# Patient Record
Sex: Male | Born: 1970
Health system: Southern US, Community
[De-identification: ages and names within clinical notes are randomized; demographics above are authoritative.]

## PROBLEM LIST (undated history)

## (undated) DIAGNOSIS — N289 Disorder of kidney and ureter, unspecified: Secondary | ICD-10-CM

## (undated) DIAGNOSIS — E785 Hyperlipidemia, unspecified: Secondary | ICD-10-CM

## (undated) DIAGNOSIS — I10 Essential (primary) hypertension: Secondary | ICD-10-CM

## (undated) DIAGNOSIS — K589 Irritable bowel syndrome without diarrhea: Secondary | ICD-10-CM

## (undated) HISTORY — DX: Hyperlipidemia, unspecified: E78.5

## (undated) HISTORY — PX: LASIK: SHX215

## (undated) HISTORY — DX: Essential (primary) hypertension: I10

---

## 2015-08-16 ENCOUNTER — Emergency Department (HOSPITAL_COMMUNITY): Payer: BLUE CROSS/BLUE SHIELD

## 2015-08-16 ENCOUNTER — Encounter (HOSPITAL_COMMUNITY): Payer: Self-pay

## 2015-08-16 ENCOUNTER — Emergency Department (HOSPITAL_COMMUNITY)
Admission: EM | Admit: 2015-08-16 | Discharge: 2015-08-16 | Disposition: A | Discharge status: Home / Self Care | Payer: BLUE CROSS/BLUE SHIELD | Attending: Emergency Medicine | Admitting: Emergency Medicine

## 2015-08-16 DIAGNOSIS — N201 Calculus of ureter: Secondary | ICD-10-CM | POA: Insufficient documentation

## 2015-08-16 DIAGNOSIS — Z79899 Other long term (current) drug therapy: Secondary | ICD-10-CM | POA: Diagnosis not present

## 2015-08-16 DIAGNOSIS — R1032 Left lower quadrant pain: Secondary | ICD-10-CM | POA: Diagnosis present

## 2015-08-16 DIAGNOSIS — R109 Unspecified abdominal pain: Secondary | ICD-10-CM

## 2015-08-16 DIAGNOSIS — N132 Hydronephrosis with renal and ureteral calculous obstruction: Secondary | ICD-10-CM | POA: Diagnosis not present

## 2015-08-16 DIAGNOSIS — Z791 Long term (current) use of non-steroidal anti-inflammatories (NSAID): Secondary | ICD-10-CM | POA: Insufficient documentation

## 2015-08-16 HISTORY — DX: Disorder of kidney and ureter, unspecified: N28.9

## 2015-08-16 HISTORY — DX: Irritable bowel syndrome, unspecified: K58.9

## 2015-08-16 LAB — BASIC METABOLIC PANEL
ANION GAP: 14 (ref 5–15)
BUN: 23 mg/dL — ABNORMAL HIGH (ref 6–20)
CALCIUM: 8.8 mg/dL — AB (ref 8.9–10.3)
CO2: 22 mmol/L (ref 22–32)
CREATININE: 1.63 mg/dL — AB (ref 0.61–1.24)
Chloride: 100 mmol/L — ABNORMAL LOW (ref 101–111)
GFR, EST AFRICAN AMERICAN: 59 mL/min — AB (ref >60)
GFR, EST NON AFRICAN AMERICAN: 50 mL/min — AB (ref >60)
Glucose, Bld: 166 mg/dL — ABNORMAL HIGH (ref 65–99)
Potassium: 3.4 mmol/L — ABNORMAL LOW (ref 3.5–5.1)
SODIUM: 136 mmol/L (ref 135–145)

## 2015-08-16 LAB — URINE MICROSCOPIC-ADD ON
Bacteria, UA: NONE SEEN
Squamous Epithelial / LPF: NONE SEEN
WBC, UA: NONE SEEN WBC/hpf (ref 0–5)

## 2015-08-16 LAB — CBC WITH DIFFERENTIAL/PLATELET
BASOS ABS: 0 10*3/uL (ref 0.0–0.1)
BASOS PCT: 0 %
EOS ABS: 0 10*3/uL (ref 0.0–0.7)
EOS PCT: 0 %
HEMATOCRIT: 43.1 % (ref 39.0–52.0)
Hemoglobin: 15.3 g/dL (ref 13.0–17.0)
Lymphocytes Relative: 11 %
Lymphs Abs: 1 10*3/uL (ref 0.7–4.0)
MCH: 31.6 pg (ref 26.0–34.0)
MCHC: 35.5 g/dL (ref 30.0–36.0)
MCV: 89 fL (ref 78.0–100.0)
MONO ABS: 0.8 10*3/uL (ref 0.1–1.0)
Monocytes Relative: 9 %
NEUTROS ABS: 7 10*3/uL (ref 1.7–7.7)
Neutrophils Relative %: 80 %
PLATELETS: 218 10*3/uL (ref 150–400)
RBC: 4.84 MIL/uL (ref 4.22–5.81)
RDW: 11.9 % (ref 11.5–15.5)
WBC: 8.8 10*3/uL (ref 4.0–10.5)

## 2015-08-16 LAB — URINALYSIS, ROUTINE W REFLEX MICROSCOPIC
BILIRUBIN URINE: NEGATIVE
GLUCOSE, UA: 100 mg/dL — AB
KETONES UR: 15 mg/dL — AB
LEUKOCYTES UA: NEGATIVE
NITRITE: NEGATIVE
PH: 6.5 (ref 5.0–8.0)
Protein, ur: NEGATIVE mg/dL
Specific Gravity, Urine: 1.015 (ref 1.005–1.030)

## 2015-08-16 LAB — LIPASE, BLOOD: Lipase: 23 U/L (ref 11–51)

## 2015-08-16 MED ORDER — ONDANSETRON HCL 4 MG/2ML IJ SOLN
4.0000 mg | Freq: Once | INTRAMUSCULAR | Status: AC | PRN
  Administered 2015-08-16: 4 mg via INTRAVENOUS
  Filled 2015-08-16: qty 2

## 2015-08-16 MED ORDER — FENTANYL CITRATE (PF) 100 MCG/2ML IJ SOLN
50.0000 ug | INTRAMUSCULAR | Status: AC | PRN
  Administered 2015-08-16 (×2): 50 ug via INTRAVENOUS
  Filled 2015-08-16 (×2): qty 2

## 2015-08-16 MED ORDER — ONDANSETRON 4 MG PO TBDP: 4.0000 mg | ORAL_TABLET | Freq: Three times a day (TID) | ORAL | Status: DC | PRN

## 2015-08-16 MED ORDER — KETOROLAC TROMETHAMINE 30 MG/ML IJ SOLN
30.0000 mg | Freq: Once | INTRAMUSCULAR | Status: AC
  Administered 2015-08-16: 30 mg via INTRAVENOUS
  Filled 2015-08-16: qty 1

## 2015-08-16 MED ORDER — SODIUM CHLORIDE 0.9 % IV BOLUS (SEPSIS)
1000.0000 mL | Freq: Once | INTRAVENOUS | Status: AC
  Administered 2015-08-16: 1000 mL via INTRAVENOUS

## 2015-08-16 MED ORDER — ONDANSETRON HCL 4 MG/2ML IJ SOLN: 4.0000 mg | Freq: Once | INTRAMUSCULAR | Status: DC | PRN

## 2015-08-16 MED ORDER — HYDROMORPHONE HCL 1 MG/ML IJ SOLN
1.0000 mg | Freq: Once | INTRAMUSCULAR | Status: AC
  Administered 2015-08-16: 1 mg via INTRAVENOUS
  Filled 2015-08-16: qty 1

## 2015-08-16 MED ORDER — NAPROXEN 500 MG PO TABS: 500.0000 mg | ORAL_TABLET | Freq: Two times a day (BID) | ORAL | Status: DC

## 2015-08-16 MED ORDER — HYDROCODONE-ACETAMINOPHEN 5-325 MG PO TABS: 1.0000 | ORAL_TABLET | Freq: Four times a day (QID) | ORAL | Status: DC | PRN

## 2015-08-16 MED ORDER — SODIUM CHLORIDE 0.9 % IV SOLN
INTRAVENOUS | Status: DC
  Administered 2015-08-16: 10:00:00 via INTRAVENOUS

## 2015-08-16 NOTE — ED Notes (Signed)
Pt reports woke up at 5 am with llq pain, nausea, and decreased urinary output.  Reports history of kidney stones.

## 2015-08-16 NOTE — Discharge Instructions (Signed)
Take the Naprosyn on a regular basis because it can relax the ureter. Take the hydrocodone as needed for pain relief. Take Zofran as needed for nausea and vomiting. Make appointment to follow-up with urology. As we discussed the right kidney showed some irregularities and they are recommending an ultrasound to further investigate that on an outpatient basis.

## 2015-08-16 NOTE — ED Provider Notes (Signed)
CSN: [id number]     Arrival date & time 08/16/15  0725 History   First MD Initiated Contact with Patient 08/16/15 [phone]     Chief Complaint  Patient presents with  . Abdominal Pain     (Consider location/radiation/quality/duration/timing/severity/associated sxs/prior Treatment) Patient is a 45 y.o. male presenting with abdominal pain. The history is provided by the patient.  Abdominal Pain Associated symptoms: nausea and vomiting   Associated symptoms: no chest pain, no fever, no hematuria and no shortness of breath    the patient with left flank pain started acutely this morning at 5 AM. Radiates to left lower quadrant the abdomen. 10 out of 10 in her mind patient a previous kidney stones. Associated with nausea and vomiting. Denies any blood in the urine no fevers. Patient's last kidney stone was about 20 years ago. Pain is 10 out of 10. Not made worse or better by anything.  Past Medical History  Diagnosis Date  . Renal disorder     kidney stones  . IBS (irritable bowel syndrome)    History reviewed. No pertinent past surgical history. No family history on file. Social History  Substance Use Topics  . Smoking status: Never Smoker   . Smokeless tobacco: None  . Alcohol Use: No    Review of Systems  Constitutional: Negative for fever.  HENT: Negative for congestion.   Respiratory: Negative for shortness of breath.   Cardiovascular: Negative for chest pain.  Gastrointestinal: Positive for nausea, vomiting and abdominal pain.  Genitourinary: Positive for flank pain. Negative for hematuria.  Musculoskeletal: Positive for back pain.  Skin: Negative for rash.  Neurological: Negative for headaches.  Hematological: Does not bruise/bleed easily.  Psychiatric/Behavioral: Negative for confusion.      Allergies  Sulfa antibiotics  Home Medications   Prior to Admission medications   Medication Sig Start Date End Date Taking? Authorizing Provider  ibuprofen (ADVIL,MOTRIN) 200  MG tablet Take 400 mg by mouth every 6 (six) hours as needed.   Yes Historical Provider, MD  loratadine (CLARITIN) 10 MG tablet Take 10 mg by mouth daily.   Yes Historical Provider, MD  HYDROcodone-acetaminophen (NORCO/VICODIN) 5-325 MG tablet Take 1-2 tablets by mouth every 6 (six) hours as needed. 08/16/15   Vanetta MuldersScott Lolah Coghlan, MD  naproxen (NAPROSYN) 500 MG tablet Take 1 tablet (500 mg total) by mouth 2 (two) times daily. 08/16/15   Vanetta MuldersScott Temitayo Covalt, MD  ondansetron (ZOFRAN ODT) 4 MG disintegrating tablet Take 1 tablet (4 mg total) by mouth every 8 (eight) hours as needed. 08/16/15   Vanetta MuldersScott Melvern Ramone, MD   BP 127/84 mmHg  Pulse 99  Temp(Src) 97.6 F (36.4 C) (Oral)  Resp 18  Ht 5\' 7"  (1.702 m)  Wt 83.915 kg  BMI 28.97 kg/m2  SpO2 93% Physical Exam  Constitutional: He is oriented to person, place, and time. He appears well-developed and well-nourished. He appears distressed.  HENT:  Head: Normocephalic and atraumatic.  Mouth/Throat: Oropharynx is clear and moist.  Eyes: Conjunctivae are normal. Pupils are equal, round, and reactive to light.  Neck: Neck supple.  Cardiovascular: Normal rate, regular rhythm and normal heart sounds.   Pulmonary/Chest: Effort normal and breath sounds normal.  Abdominal: Soft. There is no tenderness.  Musculoskeletal: Normal range of motion. He exhibits no edema.  Neurological: He is alert and oriented to person, place, and time. No cranial nerve deficit. He exhibits normal muscle tone. Coordination normal.  Skin: Skin is warm. No rash noted.  Nursing note and vitals reviewed.  ED Course  Procedures (including critical care time) Labs Review Labs Reviewed  BASIC METABOLIC PANEL - Abnormal; Notable for the following:    Potassium 3.4 (*)    Chloride 100 (*)    Glucose, Bld 166 (*)    BUN 23 (*)    Creatinine, Ser 1.63 (*)    Calcium 8.8 (*)    GFR calc non Af Amer 50 (*)    GFR calc Af Amer 59 (*)    All other components within normal limits   URINALYSIS, ROUTINE W REFLEX MICROSCOPIC (NOT AT [facility]) - Abnormal; Notable for the following:    Glucose, UA 100 (*)    Hgb urine dipstick SMALL (*)    Ketones, ur 15 (*)    All other components within normal limits  CBC WITH DIFFERENTIAL/PLATELET  LIPASE, BLOOD  URINE MICROSCOPIC-ADD ON   Results for orders placed or performed during the hospital encounter of 08/16/15  CBC with Differential  Result Value Ref Range   WBC 8.8 4.0 - 10.5 K/uL   RBC 4.84 4.22 - 5.81 MIL/uL   Hemoglobin 15.3 13.0 - 17.0 g/dL   HCT 16.1 09.6 - 04.5 %   MCV 89.0 78.0 - 100.0 fL   MCH 31.6 26.0 - 34.0 pg   MCHC 35.5 30.0 - 36.0 g/dL   RDW 40.9 81.1 - 91.4 %   Platelets 218 150 - 400 K/uL   Neutrophils Relative % 80 %   Neutro Abs 7.0 1.7 - 7.7 K/uL   Lymphocytes Relative 11 %   Lymphs Abs 1.0 0.7 - 4.0 K/uL   Monocytes Relative 9 %   Monocytes Absolute 0.8 0.1 - 1.0 K/uL   Eosinophils Relative 0 %   Eosinophils Absolute 0.0 0.0 - 0.7 K/uL   Basophils Relative 0 %   Basophils Absolute 0.0 0.0 - 0.1 K/uL  Basic metabolic panel  Result Value Ref Range   Sodium 136 135 - 145 mmol/L   Potassium 3.4 (L) 3.5 - 5.1 mmol/L   Chloride 100 (L) 101 - 111 mmol/L   CO2 22 22 - 32 mmol/L   Glucose, Bld 166 (H) 65 - 99 mg/dL   BUN 23 (H) 6 - 20 mg/dL   Creatinine, Ser 7.82 (H) 0.61 - 1.24 mg/dL   Calcium 8.8 (L) 8.9 - 10.3 mg/dL   GFR calc non Af Amer 50 (L) >60 mL/min   GFR calc Af Amer 59 (L) >60 mL/min   Anion gap 14 5 - 15  Urinalysis, Routine w reflex microscopic (not at [facility])  Result Value Ref Range   Color, Urine YELLOW YELLOW   APPearance CLEAR CLEAR   Specific Gravity, Urine 1.015 1.005 - 1.030   pH 6.5 5.0 - 8.0   Glucose, UA 100 (A) NEGATIVE mg/dL   Hgb urine dipstick SMALL (A) NEGATIVE   Bilirubin Urine NEGATIVE NEGATIVE   Ketones, ur 15 (A) NEGATIVE mg/dL   Protein, ur NEGATIVE NEGATIVE mg/dL   Nitrite NEGATIVE NEGATIVE   Leukocytes, UA NEGATIVE NEGATIVE  Lipase, blood  Result Value  Ref Range   Lipase 23 11 - 51 U/L  Urine microscopic-add on  Result Value Ref Range   Squamous Epithelial / LPF NONE SEEN NONE SEEN   WBC, UA NONE SEEN 0 - 5 WBC/hpf   RBC / HPF 0-5 0 - 5 RBC/hpf   Bacteria, UA NONE SEEN NONE SEEN     Imaging Review Ct Renal Stone Study  08/16/2015  CLINICAL DATA:  Left lower quadrant and  left flank pain since 5 a.m. this morning EXAM: CT ABDOMEN AND PELVIS WITHOUT CONTRAST TECHNIQUE: Multidetector CT imaging of the abdomen and pelvis was performed following the standard protocol without IV contrast. COMPARISON:  None. FINDINGS: Lower chest:  Mild bibasilar atelectasis. Hepatobiliary: Negative Pancreas: Negative Spleen: Negative Adrenals/Urinary Tract: Adrenal glands are normal. There is mild dilatation of the left renal collecting system and ureter. There is a 4 mm stone in the distal left ureter about 1.5 cm proximal to the ureterovesical junction. Bladder is relatively decompressed but otherwise normal. There are a few nonobstructing subcentimeter calculi in mid to lower pole right kidney. There is a prominent lateral convexity along the lower pole cortex of the right kidney measuring about 2 cm. This may simply represent normal lobulation. Mass is not excluded. Stomach/Bowel: Bowel is normal including appendix Vascular/Lymphatic: No significant vascular abnormalities. No significant adenopathy in the abdomen or pelvis. Reproductive: Negative Other: No ascites Musculoskeletal: No acute findings there are bilateral L5-S1 nonacute appearing pars defects. Minimal anterior listhesis of L5 on S1. IMPRESSION: Mild left hydronephrosis due to 4 mm stone just proximal to the ureterovesical junction on the left. Right kidney demonstrates convexity along its lateral border lower pole. Mass not excluded and renal ultrasound recommended on a nonemergent basis to exclude mass. Electronically Signed   By: Esperanza Heir M.D.   On: 08/16/2015 08:35   I have personally reviewed  and evaluated these images and lab results as part of my medical decision-making.   EKG Interpretation None      MDM   Final diagnoses:  Flank pain  Left ureteral stone    CT and symptoms consistent with left ureteral stone. 4 mm in size distal ureter good chance it will pass. Referral to urology provided. Pain control provided here. Patient also made aware of the irregularity of findings regarding the right kidney and importance of the outpatient ultrasound.    Vanetta Mulders, MD 08/16/15 [phone]

## 2015-08-20 DIAGNOSIS — N202 Calculus of kidney with calculus of ureter: Secondary | ICD-10-CM | POA: Diagnosis not present

## 2015-08-20 DIAGNOSIS — Q631 Lobulated, fused and horseshoe kidney: Secondary | ICD-10-CM | POA: Diagnosis not present

## 2015-08-20 DIAGNOSIS — Z0000 Encounter for general adult medical examination without abnormal findings: Secondary | ICD-10-CM | POA: Diagnosis not present

## 2015-09-09 ENCOUNTER — Other Ambulatory Visit: Payer: Self-pay | Admitting: Urology

## 2015-09-09 DIAGNOSIS — Q631 Lobulated, fused and horseshoe kidney: Secondary | ICD-10-CM

## 2015-09-09 DIAGNOSIS — Z0000 Encounter for general adult medical examination without abnormal findings: Secondary | ICD-10-CM | POA: Diagnosis not present

## 2015-09-16 ENCOUNTER — Ambulatory Visit (HOSPITAL_COMMUNITY)
Admission: RE | Admit: 2015-09-16 | Discharge: 2015-09-16 | Disposition: A | Discharge status: Home / Self Care | Payer: BLUE CROSS/BLUE SHIELD | Source: Ambulatory Visit | Attending: Urology | Admitting: Urology

## 2015-09-16 DIAGNOSIS — N2889 Other specified disorders of kidney and ureter: Secondary | ICD-10-CM | POA: Diagnosis not present

## 2015-09-16 DIAGNOSIS — Q631 Lobulated, fused and horseshoe kidney: Secondary | ICD-10-CM | POA: Insufficient documentation

## 2015-09-16 MED ORDER — GADOBENATE DIMEGLUMINE 529 MG/ML IV SOLN
20.0000 mL | Freq: Once | INTRAVENOUS | Status: AC | PRN
  Administered 2015-09-16: 18 mL via INTRAVENOUS

## 2015-09-30 DIAGNOSIS — Z131 Encounter for screening for diabetes mellitus: Secondary | ICD-10-CM | POA: Diagnosis not present

## 2015-09-30 DIAGNOSIS — R030 Elevated blood-pressure reading, without diagnosis of hypertension: Secondary | ICD-10-CM | POA: Diagnosis not present

## 2015-09-30 DIAGNOSIS — Z13220 Encounter for screening for lipoid disorders: Secondary | ICD-10-CM | POA: Diagnosis not present

## 2015-10-15 DIAGNOSIS — R030 Elevated blood-pressure reading, without diagnosis of hypertension: Secondary | ICD-10-CM | POA: Diagnosis not present

## 2015-10-28 DIAGNOSIS — I10 Essential (primary) hypertension: Secondary | ICD-10-CM | POA: Diagnosis not present

## 2015-11-08 DIAGNOSIS — Q631 Lobulated, fused and horseshoe kidney: Secondary | ICD-10-CM | POA: Diagnosis not present

## 2015-12-30 DIAGNOSIS — E785 Hyperlipidemia, unspecified: Secondary | ICD-10-CM | POA: Diagnosis not present

## 2015-12-30 DIAGNOSIS — I10 Essential (primary) hypertension: Secondary | ICD-10-CM | POA: Diagnosis not present

## 2016-04-26 DIAGNOSIS — H5213 Myopia, bilateral: Secondary | ICD-10-CM | POA: Diagnosis not present

## 2017-01-04 DIAGNOSIS — I10 Essential (primary) hypertension: Secondary | ICD-10-CM | POA: Diagnosis not present

## 2017-01-04 DIAGNOSIS — E785 Hyperlipidemia, unspecified: Secondary | ICD-10-CM | POA: Diagnosis not present

## 2017-01-11 DIAGNOSIS — I10 Essential (primary) hypertension: Secondary | ICD-10-CM | POA: Diagnosis not present

## 2017-01-11 DIAGNOSIS — E785 Hyperlipidemia, unspecified: Secondary | ICD-10-CM | POA: Diagnosis not present

## 2017-01-17 IMAGING — MR MR ABDOMEN WO/W CM
10 of 18 series · 22 of 48 positions shown · IV contrast (multihance)
Comparison: CT 08/16/2015

CLINICAL DATA: RIGHT renal lobular contour. Evaluate for renal
mass.

EXAM:
MRI ABDOMEN WITHOUT AND WITH CONTRAST
TECHNIQUE: Multiplanar multisequence MR imaging of the abdomen was performed
both before and after the administration of intravenous contrast.
CONTRAST:  18mL MULTIHANCE GADOBENATE DIMEGLUMINE 529 MG/ML IV SOLN

[Series 4: DWI b500 · axial · 6.0mm · 1.48mm/px · z∈[-35,+199]mm · 3 of 62 slices shown]
[im 1/62]
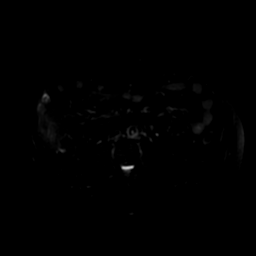
[im 31/62]
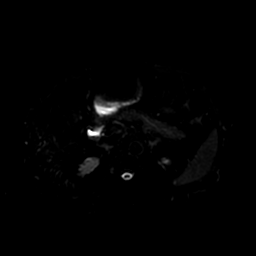
[im 62/62]
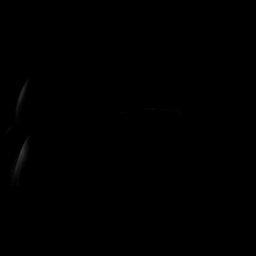

[Series 5: T2 fat-sat · axial · 5.0mm · 0.78mm/px · z∈[-34,+196]mm · 2 of 47 slices shown]
[im 1/47]
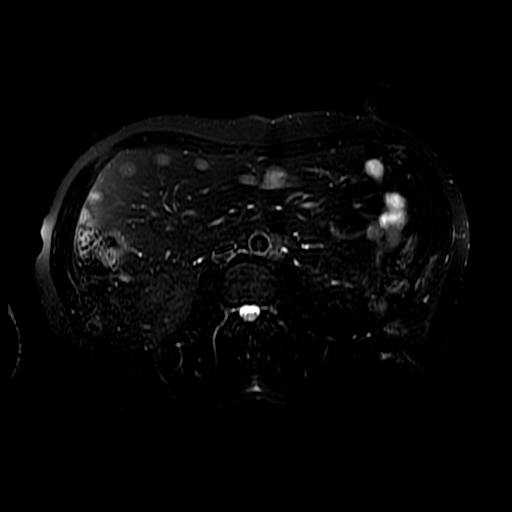
[im 47/47]
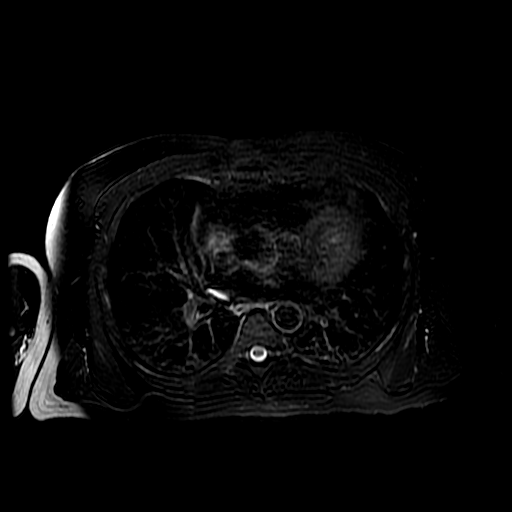

[Series 8: ax dualecho · axial · 5.0mm · 0.78mm/px · z∈[-60,+170]mm · 3 of 94 slices shown]
[im 1/94]
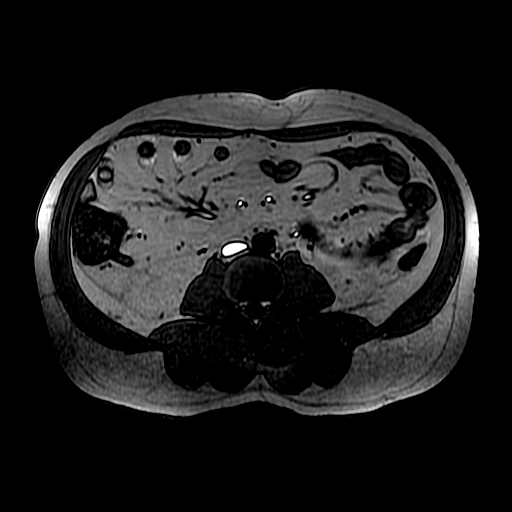
[im 47/94]
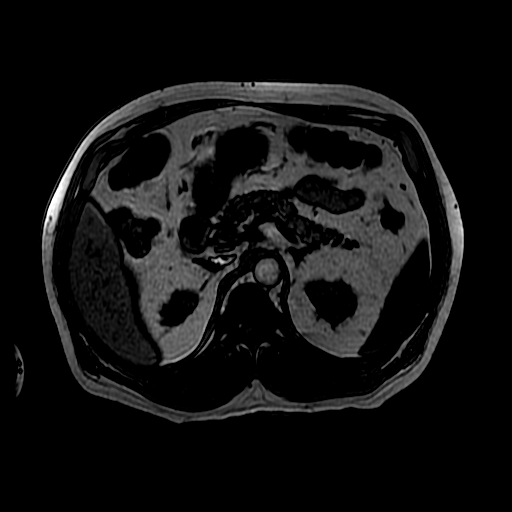
[im 94/94]
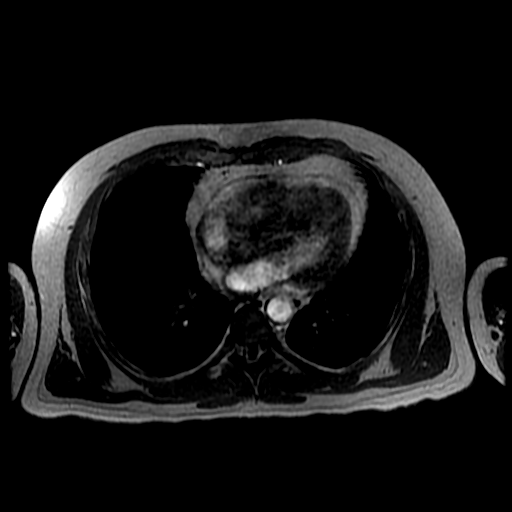

[Series 9: T2 · axial · 5.0mm · 0.78mm/px · z∈[-60,+170]mm · 2 of 47 slices shown (1 of 2)]
[im 1/47]
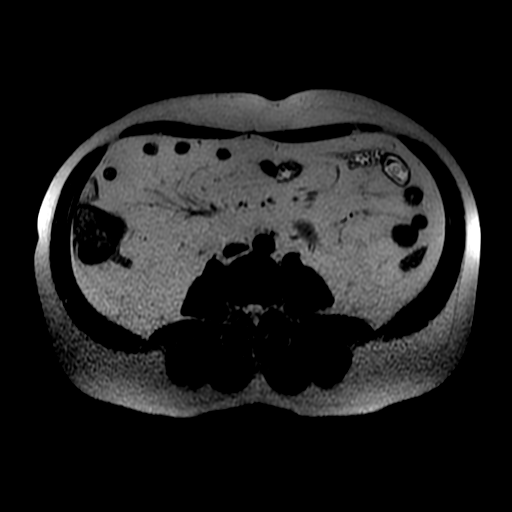
[im 47/47]
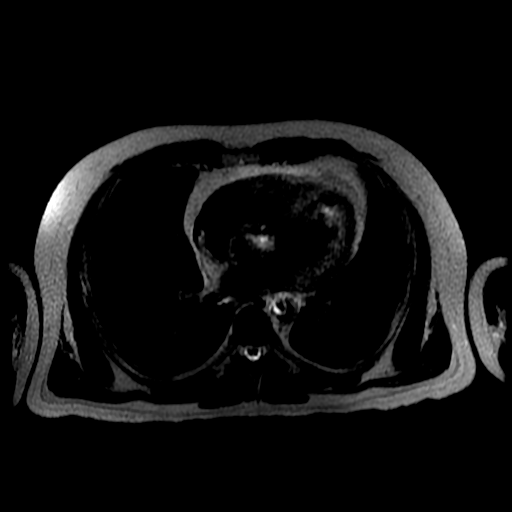

[Series 10: T2 · coronal · 5.0mm · 0.78mm/px · 2 of 43 slices shown (2 of 2)]
[im 1/43]
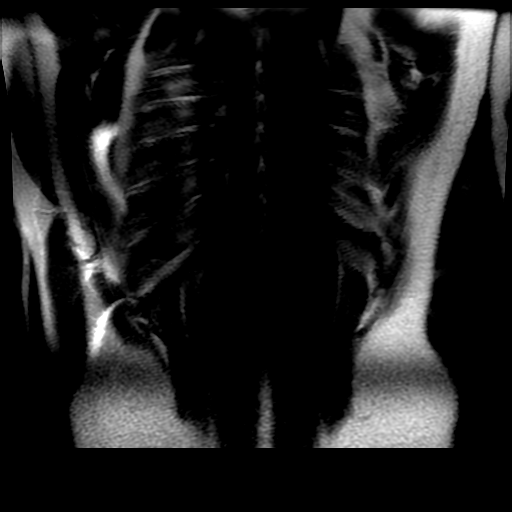
[im 43/43]
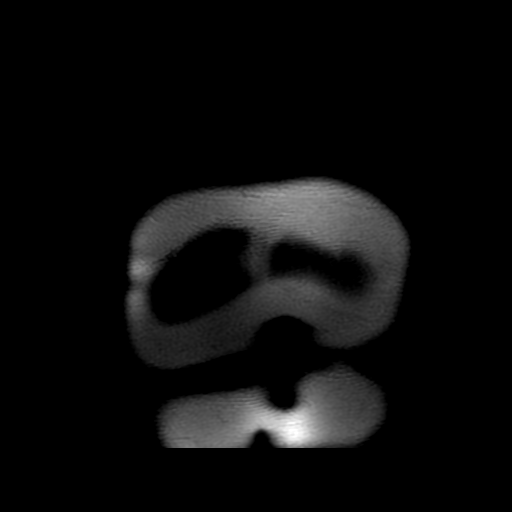

[Series 11: bSSFP · axial · 5.0mm · 0.78mm/px · z∈[-60,+170]mm · 2 of 47 slices shown]
[im 1/47]
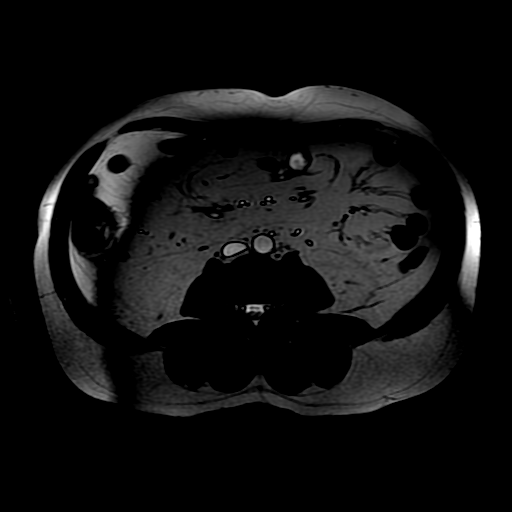
[im 47/47]
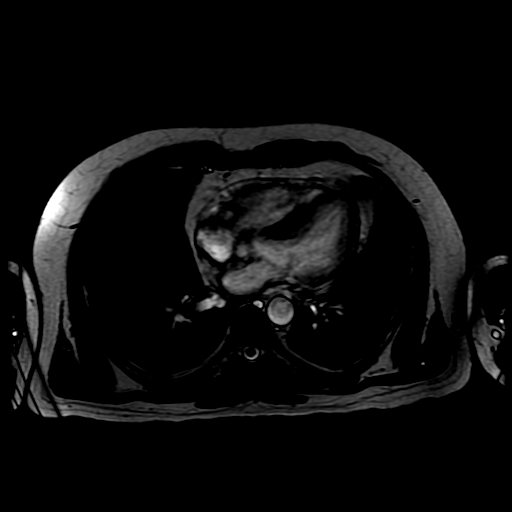

[Series 400: DWI · axial · 6.0mm · 1.48mm/px · 1 of 31 slices shown]
[im 1/31]
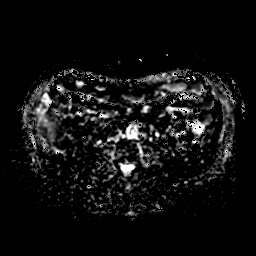

[Series 1200: T1 dynamic · axial · 5.0mm · 0.78mm/px · z∈[-68,+150]mm · 3 of 88 slices shown (1 of 3)]
[im 1/88]
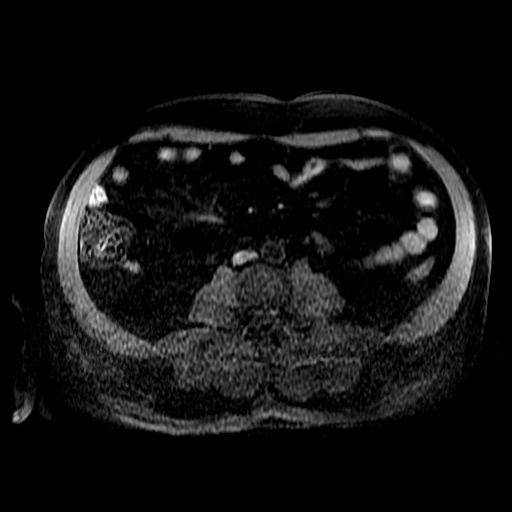
[im 44/88]
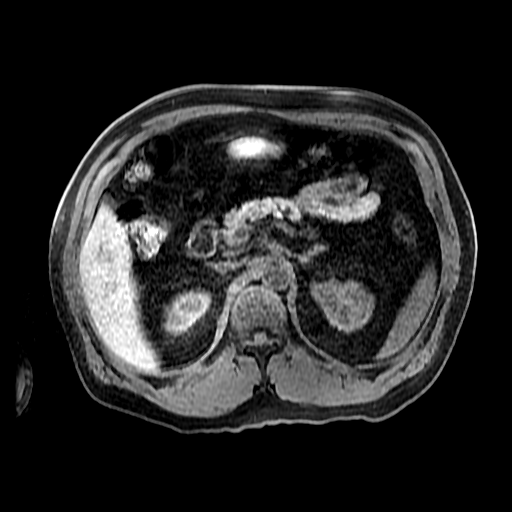
[im 88/88]
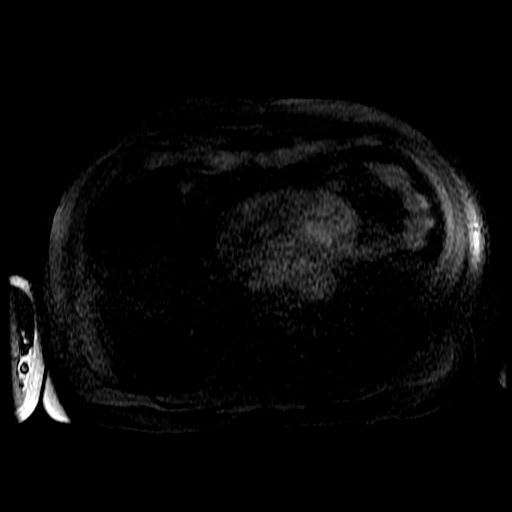

[Series 1201: T1 dynamic · axial · 5.0mm · 0.78mm/px · z∈[-68,+150]mm · 3 of 88 slices shown (2 of 3)]
[im 1/88]
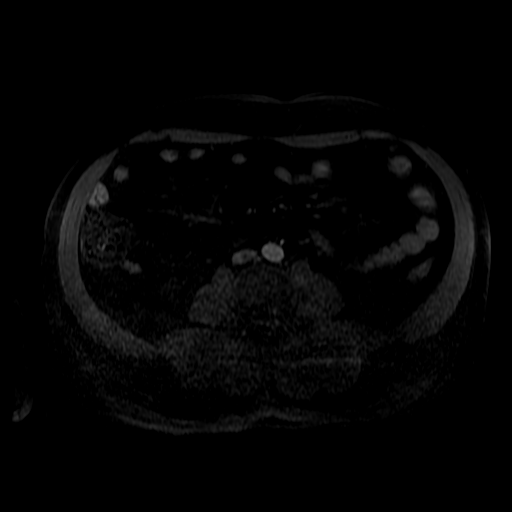
[im 44/88]
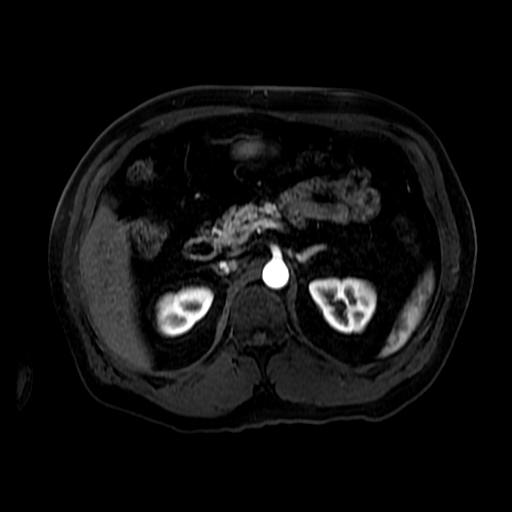
[im 88/88]
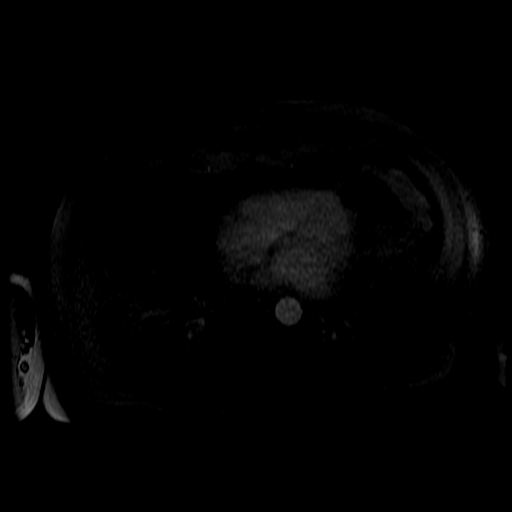

[Series 1202: T1 dynamic · axial · 5.0mm · 0.78mm/px · 1 of 88 slices shown (3 of 3)]
[im 1/88]
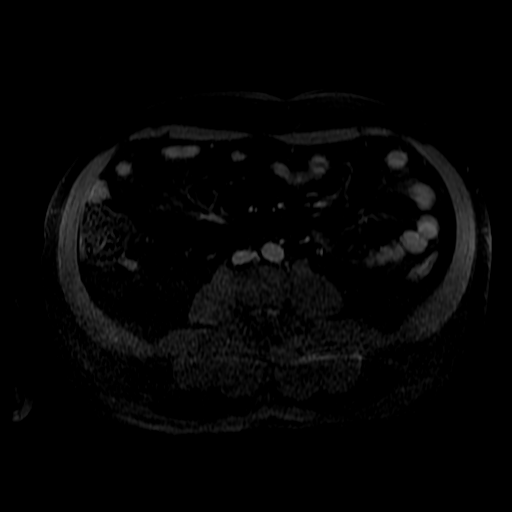

[22 of 48 positions shown; findings below may reference images not displayed]

FINDINGS: Lower chest:  Lung bases are clear.

Hepatobiliary: No focal hepatic lesion. No biliary duct dilatation.
Gallbladder and common bile duct normal.

Pancreas: Normal pancreatic parenchymal intensity. No ductal
dilatation or inflammation.

Spleen: Normal spleen.

Adrenals/urinary tract: Adrenal glands are normal.

There is a lobular contour to the lateral margin of the RIGHT kidney
however it this is represented by a benign normal cortical
parenchyma. Likewise normal LEFT kidney.

Stomach/Bowel: Stomach and limited of the small bowel is
unremarkable

Vascular/Lymphatic: Abdominal aortic normal caliber. No
retroperitoneal periportal lymphadenopathy.

Musculoskeletal: No aggressive osseous lesion
IMPRESSION: 1. Benign lobulation of the RIGHT kidney accounts for the
indeterminate CT findings. Normal kidneys.
2. Normal liver and pancreas by MRI imaging.

## 2017-03-08 DIAGNOSIS — I10 Essential (primary) hypertension: Secondary | ICD-10-CM | POA: Diagnosis not present

## 2017-03-08 DIAGNOSIS — Z23 Encounter for immunization: Secondary | ICD-10-CM | POA: Diagnosis not present

## 2017-03-08 DIAGNOSIS — G4733 Obstructive sleep apnea (adult) (pediatric): Secondary | ICD-10-CM | POA: Diagnosis not present

## 2017-03-08 DIAGNOSIS — E785 Hyperlipidemia, unspecified: Secondary | ICD-10-CM | POA: Diagnosis not present

## 2017-03-08 DIAGNOSIS — E669 Obesity, unspecified: Secondary | ICD-10-CM | POA: Diagnosis not present

## 2017-11-22 DIAGNOSIS — Z9189 Other specified personal risk factors, not elsewhere classified: Secondary | ICD-10-CM | POA: Diagnosis not present

## 2017-11-22 DIAGNOSIS — R21 Rash and other nonspecific skin eruption: Secondary | ICD-10-CM | POA: Diagnosis not present

## 2018-02-07 DIAGNOSIS — Z23 Encounter for immunization: Secondary | ICD-10-CM | POA: Diagnosis not present

## 2019-07-01 DIAGNOSIS — Z23 Encounter for immunization: Secondary | ICD-10-CM | POA: Diagnosis not present

## 2019-07-14 DIAGNOSIS — J0140 Acute pansinusitis, unspecified: Secondary | ICD-10-CM | POA: Diagnosis not present

## 2019-07-29 DIAGNOSIS — Z23 Encounter for immunization: Secondary | ICD-10-CM | POA: Diagnosis not present

## 2019-09-29 DIAGNOSIS — E785 Hyperlipidemia, unspecified: Secondary | ICD-10-CM | POA: Diagnosis not present

## 2019-09-29 DIAGNOSIS — I10 Essential (primary) hypertension: Secondary | ICD-10-CM | POA: Diagnosis not present

## 2019-10-02 DIAGNOSIS — I10 Essential (primary) hypertension: Secondary | ICD-10-CM | POA: Diagnosis not present

## 2019-10-02 DIAGNOSIS — R7303 Prediabetes: Secondary | ICD-10-CM | POA: Diagnosis not present

## 2019-10-02 DIAGNOSIS — E785 Hyperlipidemia, unspecified: Secondary | ICD-10-CM | POA: Diagnosis not present

## 2019-12-30 DIAGNOSIS — I10 Essential (primary) hypertension: Secondary | ICD-10-CM | POA: Diagnosis not present

## 2019-12-30 DIAGNOSIS — Z131 Encounter for screening for diabetes mellitus: Secondary | ICD-10-CM | POA: Diagnosis not present

## 2020-01-01 DIAGNOSIS — E785 Hyperlipidemia, unspecified: Secondary | ICD-10-CM | POA: Diagnosis not present

## 2020-01-01 DIAGNOSIS — I10 Essential (primary) hypertension: Secondary | ICD-10-CM | POA: Diagnosis not present

## 2020-01-01 DIAGNOSIS — Z125 Encounter for screening for malignant neoplasm of prostate: Secondary | ICD-10-CM | POA: Diagnosis not present

## 2020-01-01 DIAGNOSIS — R945 Abnormal results of liver function studies: Secondary | ICD-10-CM | POA: Diagnosis not present

## 2020-01-08 ENCOUNTER — Ambulatory Visit: Payer: BLUE CROSS/BLUE SHIELD | Admitting: Pulmonary Disease

## 2020-01-08 ENCOUNTER — Other Ambulatory Visit: Payer: Self-pay

## 2020-01-08 ENCOUNTER — Encounter: Payer: Self-pay | Admitting: Pulmonary Disease

## 2020-01-08 VITALS — BP 122/78 | HR 89 | Temp 97.1°F | Ht 67.0 in | Wt 188.4 lb

## 2020-01-08 DIAGNOSIS — R0683 Snoring: Secondary | ICD-10-CM

## 2020-01-08 NOTE — Progress Notes (Signed)
[facility]  Chief Complaint  Patient presents with  . Consult    sleep consult    Constitutional:  BP 122/78 (BP Location: Left Arm, Cuff Size: Normal)   Pulse 89   Temp (!) 97.1 F (36.2 C) (Other (Comment)) Comment (Src): wrist  Ht 5\' 7"  (1.702 m)   Wt 188 lb 6.4 oz (85.5 kg)   SpO2 98% Comment: Room air  BMI 29.51 kg/m   Past Medical History:  HLD, HTN, IBS, Nephrolithiasis  Past Surgical History:  He has not had any prior surgeries  Brief Summary:  Michael Stafford is a 49 y.o. male with snoring.      Subjective:  His wife has been concerned about his snoring.  She says he will get shallow breathing.  He has noticed feeling more tired in the morning and has trouble staying focused at work.  He will sometimes fall asleep while watching TV.  He goes to sleep at 11 pm.  He falls asleep in 15 minutes to 2 hours.  He needs background noise to help fall asleep.  He wakes up 1 or 2 times to use the bathroom.  He gets out of bed at 6 am.  He feels tired in the morning.  He occasionally gets morning headache.  He uses melatonin or benadryl sometimes to help fall asleep.  He will drink a couple of sodas during the day for the caffeine.  He gets a stretch feeling in his legs sometimes when trying to sleep.  This happens more when he tries to sleep on the cough, but also can happen when he is trying to sleep in bed.  He denies sleep walking, sleep talking, bruxism, or nightmares.  There is no history of restless legs.  He denies sleep hallucinations, sleep paralysis, or cataplexy.  The Epworth score is 10 out of 24.    Physical Exam:   Appearance - well kempt   ENMT - no sinus tenderness, no oral exudate, no LAN, Mallampati 4 airway, no stridor, elongated uvula  Respiratory - equal breath sounds bilaterally, no wheezing or rales  CV - s1s2 regular rate and rhythm, no murmurs  Ext - no clubbing, no edema  Skin - no  rashes  Psych - normal mood and affect   Sleep Tests:    Social History:  He  reports that he has never smoked. He has never used smokeless tobacco. He reports that he does not drink alcohol and does not use drugs.  Family History:  His family history includes COPD in his father; Coronary artery disease in his father and mother; Diabetes in his mother.    Discussion:  He has snoring, sleep disruption, apnea, and daytime sleepiness.  He has history of hypertension.  I am concerned he could have obstructive sleep apnea.  Assessment/Plan:   Snoring with excessive daytime sleepiness. - will need to arrange for a home sleep study  Obesity. - discussed how weight can impact sleep and risk for sleep disordered breathing - discussed options to assist with weight loss: combination of diet modification, cardiovascular and strength training exercises  Cardiovascular risk. - had an extensive discussion regarding the adverse health consequences related to untreated sleep disordered breathing - specifically discussed the risks for hypertension, coronary artery disease, cardiac dysrhythmias, cerebrovascular disease, and diabetes - lifestyle modification discussed  Safe driving practices. - discussed how sleep disruption can increase risk of accidents, particularly when driving - safe driving practices were discussed  Therapies for obstructive sleep apnea. - if the sleep study shows significant sleep apnea, then various therapies for treatment were reviewed: CPAP, oral appliance, and surgical interventions  Time Spent Involved in Patient Care on Day of Examination:  32 minutes  Follow up:  Patient Instructions  Will arrange for home sleep study Will call to arrange for follow up after sleep study reviewed    Medication List:   Allergies as of 01/08/2020      Reactions   Sulfa Antibiotics    Rash      Medication List       Accurate as of January 08, 2020 10:19 AM. If you  have any questions, ask your nurse or doctor.        STOP taking these medications   HYDROcodone-acetaminophen 5-325 MG tablet Commonly known as: NORCO/VICODIN Stopped by: Coralyn Helling, MD   loratadine 10 MG tablet Commonly known as: CLARITIN Stopped by: Coralyn Helling, MD   naproxen 500 MG tablet Commonly known as: NAPROSYN Stopped by: Coralyn Helling, MD   ondansetron 4 MG disintegrating tablet Commonly known as: Zofran ODT Stopped by: Coralyn Helling, MD     TAKE these medications   atorvastatin 40 MG tablet Commonly known as: LIPITOR Take 40 mg by mouth daily.   fluticasone 50 MCG/ACT nasal spray Commonly known as: FLONASE Place 2 sprays into both nostrils daily as needed.   ibuprofen 200 MG tablet Commonly known as: ADVIL Take 400 mg by mouth every 6 (six) hours as needed.   lisinopril 10 MG tablet Commonly known as: ZESTRIL Take 10 mg by mouth daily.       Signature:  Coralyn Helling, MD [facility] Pager - [phone] 01/08/2020, 10:19 AM

## 2020-01-08 NOTE — Patient Instructions (Signed)
Will arrange for home sleep study Will call to arrange for follow up after sleep study reviewed  

## 2020-02-06 ENCOUNTER — Ambulatory Visit: Payer: BC Managed Care – PPO

## 2020-02-06 ENCOUNTER — Other Ambulatory Visit: Payer: Self-pay

## 2020-02-06 DIAGNOSIS — R0683 Snoring: Secondary | ICD-10-CM

## 2020-02-06 DIAGNOSIS — G4733 Obstructive sleep apnea (adult) (pediatric): Secondary | ICD-10-CM | POA: Diagnosis not present

## 2020-02-09 ENCOUNTER — Telehealth: Payer: Self-pay | Admitting: Pulmonary Disease

## 2020-02-09 DIAGNOSIS — G4733 Obstructive sleep apnea (adult) (pediatric): Secondary | ICD-10-CM | POA: Diagnosis not present

## 2020-02-09 NOTE — Telephone Encounter (Signed)
Called and spoke with patient about sleep study results per Dr Craige Cotta. All questions answered and patient expressed full understanding. Scheduled a televisit (per Dr Craige Cotta) with Mosetta Anis NP for Thursday 02/12/2020 at 9am.  Patient agreeable to time, date and televisit and expressed understanding. Nothing further needed at this time.

## 2020-02-09 NOTE — Telephone Encounter (Signed)
HST 02/07/20 >> AHI 6.9, SpO2 low 83%.   Please inform him that his sleep study shows mild obstructive sleep apnea.  Please arrange for ROV with me or NP to discuss treatment options.

## 2020-02-11 NOTE — Progress Notes (Signed)
Virtual Visit via Telephone Note  I connected with Michael Stafford on 02/11/20 at  9:00 AM EDT by telephone and verified that I am speaking with the correct person using two identifiers.  Location: Patient: Home Provider: Office   I discussed the limitations, risks, security and privacy concerns of performing an evaluation and management service by telephone and the availability of in person appointments. I also discussed with the patient that there may be a patient responsible charge related to this service. The patient expressed understanding and agreed to proceed.   History of Present Illness: 49 year old male, never smoked. PMH significant for snoring, obesity. Patient of Dr. Craige Cotta, seen for initial consult on 01/08/20. Ordered for home sleep study. Epworth 10/24.   Previous [facility] pulmonary encounter: 01/08/20- Dr. Craige Cotta, Sleep consult  His wife has been concerned about his snoring.  She says he will get shallow breathing.  He has noticed feeling more tired in the morning and has trouble staying focused at work.  He will sometimes fall asleep while watching TV.  He goes to sleep at 11 pm.  He falls asleep in 15 minutes to 2 hours.  He needs background noise to help fall asleep.  He wakes up 1 or 2 times to use the bathroom.  He gets out of bed at 6 am.  He feels tired in the morning.  He occasionally gets morning headache.  He uses melatonin or benadryl sometimes to help fall asleep.  He will drink a couple of sodas during the day for the caffeine.  He gets a stretch feeling in his legs sometimes when trying to sleep.  This happens more when he tries to sleep on the cough, but also can happen when he is trying to sleep in bed.  02/12/2020 - Interim hx Patient contacted today to review sleep study results. Patient reports snoring and non-restorative sleep. His symptoms do not significantly impact his life. HST 02/07/20 showed mild OSA, AHI 6.9 with Spo2 low 83%. Reviewed sleep study results and  treatment options. Patient has agreed to work on weight loss and will look into oral appliance. If these do not improve sleep qualify consider CPAP.   Observations/Objective:  - Able to speak in full sentences; no overt shortness of breath or wheezing   Assessment and Plan:  Mild OSA - HST showed mild obstructive sleep apnea; AHI 6.9/hr with SpO2 low 83%  - Discussed treatment options including weight loss, oral appliance, CPAP and ENT - Recommending conservative treatment  - Refer Dr. Toni Arthurs for consideration for oral appliance - Encourage weight loss and side sleeping position - Advised patient not to drive if experiencing excessive daytime fatigue or somnolence  Follow Up Instructions:   - 6 months with Dr. Craige Cotta  I discussed the assessment and treatment plan with the patient. The patient was provided an opportunity to ask questions and all were answered. The patient agreed with the plan and demonstrated an understanding of the instructions.   The patient was advised to call back or seek an in-person evaluation if the symptoms worsen or if the condition fails to improve as anticipated.  I provided 18 minutes of non-face-to-face time during this encounter.   Glenford Bayley, NP

## 2020-02-12 ENCOUNTER — Other Ambulatory Visit: Payer: Self-pay

## 2020-02-12 ENCOUNTER — Ambulatory Visit (INDEPENDENT_AMBULATORY_CARE_PROVIDER_SITE_OTHER): Payer: BC Managed Care – PPO | Admitting: Primary Care

## 2020-02-12 DIAGNOSIS — G4733 Obstructive sleep apnea (adult) (pediatric): Secondary | ICD-10-CM | POA: Diagnosis not present

## 2020-02-12 NOTE — Progress Notes (Signed)
Reviewed and agree with assessment/plan.   [name], MD [facility] 02/12/2020, 7:15 PM Pager:  [phone]  

## 2020-02-12 NOTE — Patient Instructions (Signed)
Home sleep study showed very mild obstructive sleep apnea Recommend 10-15lb weight loss, side sleeping position and consideration for oral appliance  Referral Dr. Irene Limbo- oral appliance for OSA  Follow-up: 6 Month follow-up with Dr. Craige Cotta    Sleep Apnea Sleep apnea affects breathing during sleep. It causes breathing to stop for a short time or to become shallow. It can also increase the risk of:  Heart attack.  Stroke.  Being very overweight (obese).  Diabetes.  Heart failure.  Irregular heartbeat. The goal of treatment is to help you breathe normally again. What are the causes? There are three kinds of sleep apnea:  Obstructive sleep apnea. This is caused by a blocked or collapsed airway.  Central sleep apnea. This happens when the brain does not send the right signals to the muscles that control breathing.  Mixed sleep apnea. This is a combination of obstructive and central sleep apnea. The most common cause of this condition is a collapsed or blocked airway. This can happen if:  Your throat muscles are too relaxed.  Your tongue and tonsils are too large.  You are overweight.  Your airway is too small. What increases the risk?  Being overweight.  Smoking.  Having a small airway.  Being older.  Being male.  Drinking alcohol.  Taking medicines to calm yourself (sedatives or tranquilizers).  Having family members with the condition. What are the signs or symptoms?  Trouble staying asleep.  Being sleepy or tired during the day.  Getting angry a lot.  Loud snoring.  Headaches in the morning.  Not being able to focus your mind (concentrate).  Forgetting things.  Less interest in sex.  Mood swings.  Personality changes.  Feelings of sadness (depression).  Waking up a lot during the night to pee (urinate).  Dry mouth.  Sore throat. How is this diagnosed?  Your medical history.  A physical exam.  A test that is done when you  are sleeping (sleep study). The test is most often done in a sleep lab but may also be done at home. How is this treated?   Sleeping on your side.  Using a medicine to get rid of mucus in your nose (decongestant).  Avoiding the use of alcohol, medicines to help you relax, or certain pain medicines (narcotics).  Losing weight, if needed.  Changing your diet.  Not smoking.  Using a machine to open your airway while you sleep, such as: ? An oral appliance. This is a mouthpiece that shifts your lower jaw forward. ? A CPAP device. This device blows air through a mask when you breathe out (exhale). ? An EPAP device. This has valves that you put in each nostril. ? A BPAP device. This device blows air through a mask when you breathe in (inhale) and breathe out.  Having surgery if other treatments do not work. It is important to get treatment for sleep apnea. Without treatment, it can lead to:  High blood pressure.  Coronary artery disease.  In men, not being able to have an erection (impotence).  Reduced thinking ability. Follow these instructions at home: Lifestyle  Make changes that your doctor recommends.  Eat a healthy diet.  Lose weight if needed.  Avoid alcohol, medicines to help you relax, and some pain medicines.  Do not use any products that contain nicotine or tobacco, such as cigarettes, e-cigarettes, and chewing tobacco. If you need help quitting, ask your doctor. General instructions  Take over-the-counter and prescription medicines only as  told by your doctor.  If you were given a machine to use while you sleep, use it only as told by your doctor.  If you are having surgery, make sure to tell your doctor you have sleep apnea. You may need to bring your device with you.  Keep all follow-up visits as told by your doctor. This is important. Contact a doctor if:  The machine that you were given to use during sleep bothers you or does not seem to be  working.  You do not get better.  You get worse. Get help right away if:  Your chest hurts.  You have trouble breathing in enough air.  You have an uncomfortable feeling in your back, arms, or stomach.  You have trouble talking.  One side of your body feels weak.  A part of your face is hanging down. These symptoms may be an emergency. Do not wait to see if the symptoms will go away. Get medical help right away. Call your local emergency services (911 in the [place] Do not drive yourself to the hospital. Summary  This condition affects breathing during sleep.  The most common cause is a collapsed or blocked airway.  The goal of treatment is to help you breathe normally while you sleep. This information is not intended to replace advice given to you by your health care provider. Make sure you discuss any questions you have with your health care provider. Document Revised: 01/25/2018 Document Reviewed: 12/04/2017 Elsevier Patient Education  2020 [employer].

## 2020-03-30 DIAGNOSIS — I10 Essential (primary) hypertension: Secondary | ICD-10-CM | POA: Diagnosis not present

## 2020-03-30 DIAGNOSIS — Z131 Encounter for screening for diabetes mellitus: Secondary | ICD-10-CM | POA: Diagnosis not present

## 2020-04-01 DIAGNOSIS — I10 Essential (primary) hypertension: Secondary | ICD-10-CM | POA: Diagnosis not present

## 2020-04-01 DIAGNOSIS — R945 Abnormal results of liver function studies: Secondary | ICD-10-CM | POA: Diagnosis not present

## 2020-04-01 DIAGNOSIS — Z125 Encounter for screening for malignant neoplasm of prostate: Secondary | ICD-10-CM | POA: Diagnosis not present

## 2020-04-01 DIAGNOSIS — E785 Hyperlipidemia, unspecified: Secondary | ICD-10-CM | POA: Diagnosis not present

## 2020-04-07 DIAGNOSIS — G4733 Obstructive sleep apnea (adult) (pediatric): Secondary | ICD-10-CM | POA: Diagnosis not present

## 2020-04-14 DIAGNOSIS — Z23 Encounter for immunization: Secondary | ICD-10-CM | POA: Diagnosis not present

## 2020-08-22 DIAGNOSIS — U071 COVID-19: Secondary | ICD-10-CM

## 2020-08-22 HISTORY — DX: COVID-19: U07.1

## 2020-09-27 ENCOUNTER — Other Ambulatory Visit: Payer: Self-pay

## 2020-09-27 ENCOUNTER — Encounter: Payer: Self-pay | Admitting: Pulmonary Disease

## 2020-09-27 ENCOUNTER — Ambulatory Visit: Payer: BC Managed Care – PPO | Admitting: Pulmonary Disease

## 2020-09-27 VITALS — BP 128/78 | HR 90 | Temp 97.3°F | Ht 67.0 in | Wt 186.2 lb

## 2020-09-27 DIAGNOSIS — G4733 Obstructive sleep apnea (adult) (pediatric): Secondary | ICD-10-CM

## 2020-09-27 DIAGNOSIS — Z8616 Personal history of COVID-19: Secondary | ICD-10-CM | POA: Diagnosis not present

## 2020-09-27 NOTE — Patient Instructions (Signed)
Please ask Dr. Toni Arthurs to forward a copy of your most recent sleep study while using oral appliance  Follow up in 1 year

## 2020-09-27 NOTE — Progress Notes (Signed)
[facility]  Chief Complaint  Patient presents with  . Follow-up    Intermittent productive cough with clear phlegm since having COVID 09/17/20    Constitutional:  BP 128/78 (BP Location: Left Arm, Cuff Size: Normal)   Pulse 90   Temp (!) 97.3 F (36.3 C) (Temporal)   Ht 5\' 7"  (1.702 m)   Wt 186 lb 3.2 oz (84.5 kg)   SpO2 98% Comment: Room air  BMI 29.16 kg/m   Past Medical History:  HLD, HTN, IBS, Nephrolithiasis, COVID 09 Sep 2020  Past Surgical History:  He has not had any prior surgeries  Brief Summary:  Michael Stafford is a 50 y.o. male with obstructive sleep apnea.      Subjective:   He had home sleep study in October.  This showed mild sleep apnea.  Referred to Dr. November for oral appliance.  This has been working well and he is sleeping better.  He had home sleep study recently with Dr. Irene Limbo, but hasn't heard results yet.  His son was sick in the middle of May.  He passed this to Mr. June.  He did home COVID test on May 27 and was positive.  Didn't need any specific therapy.  He was having fever, sinus congestion, and dry cough.  Most of his symptoms are improving or resolved.    Physical Exam:   Appearance - well kempt   ENMT - no sinus tenderness, no oral exudate, no LAN, Mallampati 4 airway, no stridor  Respiratory - equal breath sounds bilaterally, no wheezing or rales  CV - s1s2 regular rate and rhythm, no murmurs  Ext - no clubbing, no edema  Skin - no rashes  Psych - normal mood and affect   Sleep Tests:  HST 02/07/20 >> AHI 6.9, SpO2 low 83%.  Social History:  He  reports that he has never smoked. He has never used smokeless tobacco. He reports that he does not drink alcohol and does not use drugs.  Family History:  His family history includes COPD in his father; Coronary artery disease in his father and mother; Diabetes in his mother.    am concerned he could have obstructive sleep  apnea.  Assessment/Plan:   Obstructive sleep apnea. - using oral appliance - asked to have copy of his recent sleep study forwarded for my review  COVID 19 infection. - from Sep 17, 2020 - clinically improving - no specific therapy at this time  Time Spent Involved in Patient Care on Day of Examination:  15 minutes  Follow up:  Patient Instructions  Please ask Dr. Sep 19, 2020 to forward a copy of your most recent sleep study while using oral appliance  Follow up in 1 year   Medication List:   Allergies as of 09/27/2020      Reactions   Sulfa Antibiotics    Rash      Medication List       Accurate as of September 27, 2020 11:41 AM. If you have any questions, ask your nurse or doctor.        atorvastatin 40 MG tablet Commonly known as: LIPITOR Take 40 mg by mouth daily.   fluticasone 50 MCG/ACT nasal spray Commonly known as: FLONASE Place 2 sprays into both nostrils daily as needed.   ibuprofen 200 MG tablet Commonly known as: ADVIL Take 400 mg by mouth every 6 (six) hours as needed.   lisinopril 10 MG tablet Commonly known as: ZESTRIL  Take 10 mg by mouth daily.       Signature:  Coralyn Helling, MD [facility] Care Pager - [phone] 09/27/2020, 11:41 AM

## 2020-10-05 DIAGNOSIS — E785 Hyperlipidemia, unspecified: Secondary | ICD-10-CM | POA: Diagnosis not present

## 2020-10-05 DIAGNOSIS — I10 Essential (primary) hypertension: Secondary | ICD-10-CM | POA: Diagnosis not present

## 2020-10-07 DIAGNOSIS — R945 Abnormal results of liver function studies: Secondary | ICD-10-CM | POA: Diagnosis not present

## 2020-10-07 DIAGNOSIS — E785 Hyperlipidemia, unspecified: Secondary | ICD-10-CM | POA: Diagnosis not present

## 2020-10-07 DIAGNOSIS — G4733 Obstructive sleep apnea (adult) (pediatric): Secondary | ICD-10-CM | POA: Diagnosis not present

## 2020-10-07 DIAGNOSIS — I10 Essential (primary) hypertension: Secondary | ICD-10-CM | POA: Diagnosis not present

## 2021-01-12 DIAGNOSIS — Z131 Encounter for screening for diabetes mellitus: Secondary | ICD-10-CM | POA: Diagnosis not present

## 2021-01-12 DIAGNOSIS — I10 Essential (primary) hypertension: Secondary | ICD-10-CM | POA: Diagnosis not present

## 2021-09-29 ENCOUNTER — Ambulatory Visit: Payer: BC Managed Care – PPO | Admitting: Pulmonary Disease

## 2021-11-02 ENCOUNTER — Ambulatory Visit: Payer: BC Managed Care – PPO | Admitting: Pulmonary Disease

## 2021-11-02 ENCOUNTER — Encounter: Payer: Self-pay | Admitting: Pulmonary Disease

## 2021-11-02 VITALS — BP 132/70 | HR 78 | Temp 98.4°F | Ht 67.0 in | Wt 196.8 lb

## 2021-11-02 DIAGNOSIS — Z72821 Inadequate sleep hygiene: Secondary | ICD-10-CM

## 2021-11-02 DIAGNOSIS — G4733 Obstructive sleep apnea (adult) (pediatric): Secondary | ICD-10-CM

## 2021-11-02 NOTE — Progress Notes (Signed)
[facility]  Chief Complaint  Patient presents with   Follow-up    Using oral appliance and working well.     Constitutional:  BP 132/70 (BP Location: Left Arm, Patient Position: Sitting)   Pulse 78   Temp 98.4 F (36.9 C) (Temporal)   Ht 5\' 7"  (1.702 m)   Wt 196 lb 12.8 oz (89.3 kg)   SpO2 98% Comment: ra  BMI 30.82 kg/m   Past Medical History:  HLD, HTN, IBS, Nephrolithiasis, COVID 09 Sep 2020  Past Surgical History:  He  has a past surgical history that includes LASIK (Bilateral).  Brief Summary:  Michael Stafford is a 51 y.o. male with obstructive sleep apnea.      Subjective:   He sleeps okay unless he drinks caffeine beverages too close to bed time.  He will use melatonin occasionally.  He uses his oral appliance and this helps his sleep quality.  He is worried about teeth misalignment and skips using his oral appliance a couple times per week.  His sleep is worse on these nights.  He naps for about 10 minutes during his lung break.  Physical Exam:   Appearance - well kempt   ENMT - no sinus tenderness, no oral exudate, no LAN, Mallampati 4 airway, no stridor  Respiratory - equal breath sounds bilaterally, no wheezing or rales  CV - s1s2 regular rate and rhythm, no murmurs  Ext - no clubbing, no edema  Skin - no rashes  Psych - normal mood and affect    Sleep Tests:  HST 02/07/20 >> AHI 6.9, SpO2 low 83%.  Social History:  He  reports that he has never smoked. He has never used smokeless tobacco. He reports that he does not drink alcohol and does not use drugs.  Family History:  His family history includes COPD in his father; Coronary artery disease in his father and mother; Diabetes in his mother.     Assessment/Plan:   Obstructive sleep apnea. - he has oral appliance set up through Dr. 02/09/20 - discussed importance of using his appliance on a nightly basis - reviewed proper sleep hygiene,  and trying to avoid caffeine too close to bedtime - reviewed symptoms to monitor for that would indicate he needs additional sleep testing - discussed importance of weight management - he would like to continue yearly follow up appointments  Time Spent Involved in Patient Care on Day of Examination:  25 minutes  Follow up:   Patient Instructions  Follow up in 1 year  Medication List:   Allergies as of 11/02/2021       Reactions   Sulfa Antibiotics    Rash        Medication List        Accurate as of November 02, 2021  9:12 AM. If you have any questions, ask your nurse or doctor.          atorvastatin 40 MG tablet Commonly known as: LIPITOR Take 40 mg by mouth daily.   fluticasone 50 MCG/ACT nasal spray Commonly known as: FLONASE Place 2 sprays into both nostrils daily as needed.   ibuprofen 200 MG tablet Commonly known as: ADVIL Take 400 mg by mouth every 6 (six) hours as needed.   lisinopril 10 MG tablet Commonly known as: ZESTRIL Take 10 mg by mouth daily.        Signature:  November 04, 2021, MD [facility] Pulmonary/Critical Care Pager - [phone]  11/02/2021, 9:12 AM

## 2021-11-02 NOTE — Patient Instructions (Signed)
Follow up in 1 year.

## 2022-05-10 ENCOUNTER — Ambulatory Visit
Admission: EM | Admit: 2022-05-10 | Discharge: 2022-05-10 | Disposition: A | Discharge status: Home / Self Care | Payer: BC Managed Care – PPO | Attending: Family Medicine | Admitting: Family Medicine

## 2022-05-10 DIAGNOSIS — R0981 Nasal congestion: Secondary | ICD-10-CM

## 2022-05-10 DIAGNOSIS — H01001 Unspecified blepharitis right upper eyelid: Secondary | ICD-10-CM | POA: Diagnosis not present

## 2022-05-10 MED ORDER — CEPHALEXIN 500 MG PO CAPS: 500.0000 mg | ORAL_CAPSULE | Freq: Two times a day (BID) | ORAL | 0 refills | Status: DC

## 2022-05-10 NOTE — ED Provider Notes (Signed)
[facility]    CSN: [id number] Arrival date & time: 05/10/22  9518      History   Chief Complaint No chief complaint on file.   HPI Michael Stafford is a 52 y.o. male.   Patient presenting today with a 3-day history of a progressively worsening stye to the right upper eyelid.  He is now having redness, pain, swelling to the entire eyelid and pain and pressure, headache to this region.  Denies injury to the eye, visual change, eye redness, thick drainage, nausea, vomiting.  He is also having some nasal congestion at the same time, wondering if they are related.  Trying Aleve with minimal relief.    Past Medical History:  Diagnosis Date   COVID-19 08/2020   Hyperlipidemia    Hypertension    IBS (irritable bowel syndrome)    Renal disorder    kidney stones    Patient Active Problem List   Diagnosis Date Noted   OSA (obstructive sleep apnea) 02/12/2020    Past Surgical History:  Procedure Laterality Date   LASIK Bilateral        Home Medications    Prior to Admission medications   Medication Sig Start Date End Date Taking? Authorizing Provider  cephALEXin (KEFLEX) 500 MG capsule Take 1 capsule (500 mg total) by mouth 2 (two) times daily. 05/10/22  Yes Volney American, PA-C  atorvastatin (LIPITOR) 40 MG tablet Take 40 mg by mouth daily. 12/07/19   [provider]  fluticasone (FLONASE) 50 MCG/ACT nasal spray Place 2 sprays into both nostrils daily as needed.  07/14/19   [provider]  ibuprofen (ADVIL,MOTRIN) 200 MG tablet Take 400 mg by mouth every 6 (six) hours as needed.    [provider]  lisinopril (ZESTRIL) 10 MG tablet Take 10 mg by mouth daily. 11/25/19   [provider]    Family History Family History  Problem Relation Age of Onset   Coronary artery disease Mother    Diabetes Mother    COPD Father    Coronary artery disease Father     Social History Social History   Tobacco Use   Smoking  status: Never   Smokeless tobacco: Never  Substance Use Topics   Alcohol use: No   Drug use: No     Allergies   Sulfa antibiotics   Review of Systems Review of Systems Per HPI  Physical Exam Triage Vital Signs ED Triage Vitals  Enc Vitals Group     BP 05/10/22 0958 128/86     Pulse Rate 05/10/22 0958 (!) 103     Resp 05/10/22 0958 18     Temp 05/10/22 0958 97.6 F (36.4 C)     Temp Source 05/10/22 0958 Oral     SpO2 05/10/22 0958 98 %     Weight --      Height --      Head Circumference --      Peak Flow --      Pain Score 05/10/22 1001 7     Pain Loc --      Pain Edu? --      Excl. in [place]? --    No data found.  Updated Vital Signs BP 128/86 (BP Location: Right Arm)   Pulse (!) 103   Temp 97.6 F (36.4 C) (Oral)   Resp 18   SpO2 98%   Visual Acuity Right Eye Distance:   Left Eye Distance:   Bilateral Distance:    Right  Eye Near:   Left Eye Near:    Bilateral Near:     Physical Exam Vitals and nursing note reviewed.  Constitutional:      Appearance: He is well-developed.  HENT:     Head: Atraumatic.     Right Ear: External ear normal.     Left Ear: External ear normal.     Nose: Rhinorrhea present.     Mouth/Throat:     Pharynx: No oropharyngeal exudate.  Eyes:     Extraocular Movements: Extraocular movements intact.     Conjunctiva/sclera: Conjunctivae normal.     Pupils: Pupils are equal, round, and reactive to light.     Comments: Right upper eyelid diffusely erythematous, edematous  Cardiovascular:     Rate and Rhythm: Normal rate and regular rhythm.  Pulmonary:     Effort: Pulmonary effort is normal. No respiratory distress.     Breath sounds: No wheezing or rales.  Musculoskeletal:        General: Normal range of motion.     Cervical back: Normal range of motion and neck supple.  Lymphadenopathy:     Cervical: No cervical adenopathy.  Skin:    General: Skin is warm and dry.  Neurological:     Mental Status: He is alert and  oriented to person, place, and time.  Psychiatric:        Behavior: Behavior normal.      UC Treatments / Results  Labs (all labs ordered are listed, but only abnormal results are displayed) Labs Reviewed - No data to display  EKG   Radiology No results found.  Procedures Procedures (including critical care time)  Medications Ordered in UC Medications - No data to display  Initial Impression / Assessment and Plan / UC Course  I have reviewed the triage vital signs and the nursing notes.  Pertinent labs & imaging results that were available during my care of the patient were reviewed by me and considered in my medical decision making (see chart for details).     Given diffuse extent at this point, we will forego topical antibiotics and treat with Keflex, warm compresses, over-the-counter pain relievers.  Discussed Flonase, sinus rinses for his nasal congestion that is likely unrelated.  Return for worsening symptoms.  Final Clinical Impressions(s) / UC Diagnoses   Final diagnoses:  Blepharitis of right upper eyelid, unspecified type  Nasal congestion     Discharge Instructions      Apply warm compresses to the right eye off-and-on throughout the day, use lubricating eyedrops and take the full course of antibiotics.  Regarding her nasal congestion, use Flonase twice daily, decongestants and sinus rinses with warm saline    ED Prescriptions     Medication Sig Dispense Auth. Provider   cephALEXin (KEFLEX) 500 MG capsule Take 1 capsule (500 mg total) by mouth 2 (two) times daily. 14 capsule Volney American, [place]      PDMP not reviewed this encounter.   Volney American, [place] 05/10/22 1054

## 2022-05-10 NOTE — Discharge Instructions (Signed)
Apply warm compresses to the right eye off-and-on throughout the day, use lubricating eyedrops and take the full course of antibiotics.  Regarding her nasal congestion, use Flonase twice daily, decongestants and sinus rinses with warm saline

## 2022-05-10 NOTE — ED Triage Notes (Signed)
Pt reports he has a stye on his right x 3 days. He also has a headache with some sinus pressure. Took aleve

## 2022-07-22 ENCOUNTER — Ambulatory Visit
Admission: RE | Admit: 2022-07-22 | Discharge: 2022-07-22 | Disposition: A | Discharge status: Home / Self Care | Payer: BC Managed Care – PPO | Source: Ambulatory Visit | Attending: Nurse Practitioner | Admitting: Nurse Practitioner

## 2022-07-22 ENCOUNTER — Ambulatory Visit (INDEPENDENT_AMBULATORY_CARE_PROVIDER_SITE_OTHER): Payer: BC Managed Care – PPO

## 2022-07-22 ENCOUNTER — Other Ambulatory Visit: Payer: Self-pay

## 2022-07-22 ENCOUNTER — Emergency Department (HOSPITAL_COMMUNITY): Payer: BC Managed Care – PPO

## 2022-07-22 ENCOUNTER — Encounter (HOSPITAL_COMMUNITY): Payer: Self-pay | Admitting: *Deleted

## 2022-07-22 ENCOUNTER — Emergency Department (HOSPITAL_COMMUNITY)
Admission: EM | Admit: 2022-07-22 | Discharge: 2022-07-22 | Disposition: A | Discharge status: Home / Self Care | Payer: BC Managed Care – PPO | Attending: Emergency Medicine | Admitting: Emergency Medicine

## 2022-07-22 VITALS — BP 125/87 | HR 98 | Temp 98.7°F | Resp 18 | Wt 195.0 lb

## 2022-07-22 DIAGNOSIS — R079 Chest pain, unspecified: Secondary | ICD-10-CM

## 2022-07-22 DIAGNOSIS — R198 Other specified symptoms and signs involving the digestive system and abdomen: Secondary | ICD-10-CM

## 2022-07-22 LAB — CBC
HCT: 42.8 % (ref 39.0–52.0)
Hemoglobin: 15.2 g/dL (ref 13.0–17.0)
MCH: 32.5 pg (ref 26.0–34.0)
MCHC: 35.5 g/dL (ref 30.0–36.0)
MCV: 91.6 fL (ref 80.0–100.0)
Platelets: 243 10*3/uL (ref 150–400)
RBC: 4.67 MIL/uL (ref 4.22–5.81)
RDW: 11.5 % (ref 11.5–15.5)
WBC: 5.7 10*3/uL (ref 4.0–10.5)
nRBC: 0 % (ref 0.0–0.2)

## 2022-07-22 LAB — BASIC METABOLIC PANEL
Anion gap: 10 (ref 5–15)
BUN: 16 mg/dL (ref 6–20)
CO2: 24 mmol/L (ref 22–32)
Calcium: 8.8 mg/dL — ABNORMAL LOW (ref 8.9–10.3)
Chloride: 103 mmol/L (ref 98–111)
Creatinine, Ser: 1.15 mg/dL (ref 0.61–1.24)
GFR, Estimated: >60 mL/min (ref >60)
Glucose, Bld: 86 mg/dL (ref 70–99)
Potassium: 3.7 mmol/L (ref 3.5–5.1)
Sodium: 137 mmol/L (ref 135–145)

## 2022-07-22 LAB — TROPONIN I (HIGH SENSITIVITY)
Troponin I (High Sensitivity): <2 ng/L (ref <18)
Troponin I (High Sensitivity): 2 ng/L (ref <18)

## 2022-07-22 MED ORDER — IOHEXOL 350 MG/ML SOLN
75.0000 mL | Freq: Once | INTRAVENOUS | Status: AC | PRN
  Administered 2022-07-22: 75 mL via INTRAVENOUS

## 2022-07-22 MED ORDER — OMEPRAZOLE 20 MG PO CPDR: 20.0000 mg | DELAYED_RELEASE_CAPSULE | Freq: Every day | ORAL | 1 refills | Status: DC

## 2022-07-22 MED ORDER — ALUM & MAG HYDROXIDE-SIMETH 200-200-20 MG/5ML PO SUSP
30.0000 mL | Freq: Once | ORAL | Status: AC
  Administered 2022-07-22: 30 mL via ORAL

## 2022-07-22 MED ORDER — LIDOCAINE VISCOUS HCL 2 % MT SOLN
15.0000 mL | Freq: Once | OROMUCOSAL | Status: AC
  Administered 2022-07-22: 15 mL via OROMUCOSAL

## 2022-07-22 NOTE — ED Provider Notes (Signed)
[facility]    CSN: [id number] Arrival date & time: 07/22/22  1247      History   Chief Complaint Chief Complaint  Patient presents with   Heartburn    Left side flank pain, belching, acid reflux for two days - Entered by patient    HPI Michael Stafford is a 52 y.o. male.   The history is provided by the patient.   The patient presents for complaints of left-sided chest pain with belching and reflux for the past 2 to 3 days.  Patient states this has been going on since the age of 67 for him.  He states his last episode was approximately 6 months ago.  Patient states that he has never been seen by his PCP for his symptoms.  He states that he has pain in the left side of his chest that radiates to the left upper back.  Pain worsens with movement.  He denies fever, chills, diaphoresis, nausea, vomiting, bloody stools, melena stools, gas, or bloating.  He states that he has been using Prilosec, Pepcid AC, and Mylanta for his symptoms with minimal relief.  Patient states that he does have a primary care physician, he is not scheduled to see him until May.  Past Medical History:  Diagnosis Date   COVID-19 08/2020   Hyperlipidemia    Hypertension    IBS (irritable bowel syndrome)    Renal disorder    kidney stones    Patient Active Problem List   Diagnosis Date Noted   OSA (obstructive sleep apnea) 02/12/2020    Past Surgical History:  Procedure Laterality Date   LASIK Bilateral        Home Medications    Prior to Admission medications   Medication Sig Start Date End Date Taking? Authorizing Provider  atorvastatin (LIPITOR) 40 MG tablet Take 40 mg by mouth daily. 12/07/19  Yes [provider]  fluticasone (FLONASE) 50 MCG/ACT nasal spray Place 2 sprays into both nostrils daily as needed.  07/14/19  Yes [provider]  ibuprofen (ADVIL,MOTRIN) 200 MG tablet Take 400 mg by mouth every 6 (six) hours as needed.   Yes [provider]   lisinopril (ZESTRIL) 10 MG tablet Take 10 mg by mouth daily. 11/25/19  Yes [provider]  cephALEXin (KEFLEX) 500 MG capsule Take 1 capsule (500 mg total) by mouth 2 (two) times daily. 05/10/22   Volney American, PA-C    Family History Family History  Problem Relation Age of Onset   Coronary artery disease Mother    Diabetes Mother    COPD Father    Coronary artery disease Father     Social History Social History   Tobacco Use   Smoking status: Never   Smokeless tobacco: Never  Substance Use Topics   Alcohol use: No   Drug use: No     Allergies   Sulfa antibiotics   Review of Systems Review of Systems Per HPI  Physical Exam Triage Vital Signs ED Triage Vitals  Enc Vitals Group     BP 07/22/22 1303 125/87     Pulse Rate 07/22/22 1303 98     Resp 07/22/22 1303 18     Temp 07/22/22 1303 98.7 F (37.1 C)     Temp Source 07/22/22 1303 Oral     SpO2 07/22/22 1303 95 %     Weight 07/22/22 1302 195 lb (88.5 kg)     Height --      Head Circumference --  Peak Flow --      Pain Score 07/22/22 1301 4     Pain Loc --      Pain Edu? --      Excl. in [place]? --    No data found.  Updated Vital Signs BP 125/87 (BP Location: Right Arm)   Pulse 98   Temp 98.7 F (37.1 C) (Oral)   Resp 18   Wt 195 lb (88.5 kg)   SpO2 95%   BMI 30.54 kg/m   Visual Acuity Right Eye Distance:   Left Eye Distance:   Bilateral Distance:    Right Eye Near:   Left Eye Near:    Bilateral Near:     Physical Exam Vitals and nursing note reviewed.  Constitutional:      General: He is not in acute distress.    Appearance: He is well-developed.  HENT:     Head: Normocephalic and atraumatic.  Eyes:     Extraocular Movements: Extraocular movements intact.     Conjunctiva/sclera: Conjunctivae normal.     Pupils: Pupils are equal, round, and reactive to light.  Cardiovascular:     Rate and Rhythm: Normal rate and regular rhythm.     Pulses: Normal pulses.      Heart sounds: No murmur heard. Pulmonary:     Effort: Pulmonary effort is normal. No respiratory distress.     Breath sounds: Normal breath sounds. No stridor. No wheezing, rhonchi or rales.     Comments: Left chest wall/rib cage pain is not reproducible with palpation. Abdominal:     General: Abdomen is protuberant. Bowel sounds are normal.     Palpations: Abdomen is soft.     Tenderness: There is no abdominal tenderness.     Comments: Patient was administered GI cocktail with no relief.  Musculoskeletal:        General: No swelling.     Cervical back: Normal range of motion.     Right lower leg: No edema.     Left lower leg: No edema.  Skin:    General: Skin is warm and dry.     Capillary Refill: Capillary refill takes less than 2 seconds.  Neurological:     General: No focal deficit present.     Mental Status: He is alert and oriented to person, place, and time.  Psychiatric:        Mood and Affect: Mood normal.        Behavior: Behavior normal.      UC Treatments / Results  Labs (all labs ordered are listed, but only abnormal results are displayed) Labs Reviewed - No data to display  EKG: Normal sinus rhythm, right bundle branch block, no STEMI.  No other comparisons available in epic/care everywhere.   Radiology DG Chest 2 View  Result Date: 07/22/2022 CLINICAL DATA:  Three day history of left-sided chest and back pain EXAM: CHEST - 2 VIEW COMPARISON:  None Available. FINDINGS: Normal lung volumes. Rounded opacity projecting over the right hemidiaphragm without correlate on lateral view. No pleural effusion or pneumothorax. The heart size and mediastinal contours are within normal limits. The visualized skeletal structures are unremarkable. IMPRESSION: 1. Rounded opacity projecting over the right hemidiaphragm without correlate on lateral view. While this finding may reflect artifact related to superimposition of shadows, noncontrast chest CT can be considered for further  evaluation. 2. Otherwise, no focal consolidations. Electronically Signed   By: Darrin Nipper M.D.   On: 07/22/2022 13:57    Procedures Procedures (  including critical care time)  Medications Ordered in UC Medications  alum & mag hydroxide-simeth (MAALOX/MYLANTA) 200-200-20 MG/5ML suspension 30 mL (30 mLs Oral Given 07/22/22 1357)  lidocaine (XYLOCAINE) 2 % viscous mouth solution 15 mL (15 mLs Mouth/Throat Given 07/22/22 1358)    Initial Impression / Assessment and Plan / UC Course  I have reviewed the triage vital signs and the nursing notes.  Pertinent labs & imaging results that were available during my care of the patient were reviewed by me and considered in my medical decision making (see chart for details).  The patient is well-appearing, he is in no acute distress, vital signs are stable.  Reflux symptoms not resolved with GI cocktail.  Chest x-ray shows rounded opacity over the right hemidiaphragm.  EKG shows normal sinus rhythm, no STEMI, but with right bundle branch block.  No other comparisons available at this time.  Discussion with patient regarding the findings of his workup.  Patient was advised that because of the chest x-ray, and nonreproducible pain, recommended that he follow-up in the emergency department for further evaluation.  Patient with history of reflux disease.  Advised patient to double up on his Prilosec, he should take 40 mg daily to see if this helps with his symptoms.  Patient is scheduled to follow-up with his PCP in May.  Patient was given the recommendation that he should follow-up in the emergency department.  Patient's vital signs are stable.  He is able to travel via private vehicle.   Final Clinical Impressions(s) / UC Diagnoses   Final diagnoses:  Left-sided chest pain  Symptoms of gastroesophageal reflux     Discharge Instructions      As discussed, it is recommended that you follow-up in the emergency department for further evaluation.  As noted on  your chest x-ray, there is an opacity of your right hemidiaphragm, that could be ruled out with a noncontrast CT scan.  Also, based on your EKG, because there are no other comparisons, and because you are having left-sided chest pain, it is my recommendation that you go to the emergency department for further evaluation.      ED Prescriptions   None    PDMP not reviewed this encounter.   Tish Men, NP 07/22/22 [phone]

## 2022-07-22 NOTE — ED Triage Notes (Signed)
Pt states: left side and back pain, uncontrollable belching acid reflux. X3 days  Prilosec,pepcid mylanta

## 2022-07-22 NOTE — Discharge Instructions (Addendum)
Continue taking Prilosec before your first meal the day by 30 minutes.  Drink plenty of fluids, follow-up with cardiology.  You should receive a call in 72 hours, if you do not hear from them by Wednesday please call the number and schedule follow-up.  Call your primary with no you are seen in the ED for further evaluation.  Attached is your CT results for reference

## 2022-07-22 NOTE — Discharge Instructions (Addendum)
As discussed, it is recommended that you follow-up in the emergency department for further evaluation.  As noted on your chest x-ray, there is an opacity of your right hemidiaphragm, that could be ruled out with a noncontrast CT scan.  Also, based on your EKG, because there are no other comparisons, and because you are having left-sided chest pain, it is my recommendation that you go to the emergency department for further evaluation.

## 2022-07-22 NOTE — ED Triage Notes (Signed)
Pt with left rib pain x 3 days.  Pt seen at [facility] and sent here. Pt states EKG was done at [facility].

## 2022-07-22 NOTE — ED Provider Notes (Signed)
[facility] Provider Note   CSN: [id number] Arrival date & time: 07/22/22  1532     History  Chief Complaint  Patient presents with   Chest Pain    Michael Stafford is a 52 y.o. male.   Chest Pain    Patient with medical [place] hypertension, lipidemia, IBS presents to the emergency department due to left-sided chest pain.  Symptoms started 3 days ago, they are to the left.  There is nipple and radiates to his left side to the back.  It is constant, is alleviated by certain positions, associated with nausea but no vomiting.  Denies feeling short of breath, no hematuria, dysuria, flank pain, abdominal pain, loss of consciousness.  No recent change in medication.  Takes lisinopril and atorvastatin no other medications.  Denies any history of CAD.  Does not smoke any cigarettes.  Seen urgent care, sent to ED for further evaluation.  Home Medications Prior to Admission medications   Medication Sig Start Date End Date Taking? Authorizing Provider  atorvastatin (LIPITOR) 40 MG tablet Take 40 mg by mouth daily. 12/07/19   [provider]  cephALEXin (KEFLEX) 500 MG capsule Take 1 capsule (500 mg total) by mouth 2 (two) times daily. 05/10/22   Volney American, PA-C  fluticasone [facility]) 50 MCG/ACT nasal spray Place 2 sprays into both nostrils daily as needed.  07/14/19   [provider]  ibuprofen (ADVIL,MOTRIN) 200 MG tablet Take 400 mg by mouth every 6 (six) hours as needed.    [provider]  lisinopril (ZESTRIL) 10 MG tablet Take 10 mg by mouth daily. 11/25/19   [provider]      Allergies    Sulfa antibiotics    Review of Systems   Review of Systems  Cardiovascular:  Positive for chest pain.    Physical Exam Updated Vital Signs BP 123/85 (BP Location: Right Arm)   Pulse 90   Temp 97.8 F (36.6 C) (Temporal)   Resp 19   Ht 5\' 7"  (1.702 m)   Wt 88.5 kg   SpO2 95%   BMI 30.54 kg/m   Physical Exam Vitals and nursing note reviewed. Exam conducted with a chaperone present.  Constitutional:      Appearance: Normal appearance.  HENT:     Head: Normocephalic and atraumatic.  Eyes:     General: No scleral icterus.       Right eye: No discharge.        Left eye: No discharge.     Extraocular Movements: Extraocular movements intact.     Pupils: Pupils are equal, round, and reactive to light.  Cardiovascular:     Rate and Rhythm: Normal rate and regular rhythm.     Pulses: Normal pulses.     Heart sounds: Normal heart sounds. No murmur heard.    No friction rub. No gallop.     Comments: Upper and lower extremity pulses are symmetric bilaterally.  Regular rate and rhythm on my evaluation, occasional PAC on cardiac monitoring. Pulmonary:     Effort: Pulmonary effort is normal. No respiratory distress.     Breath sounds: Normal breath sounds.  Abdominal:     General: Abdomen is flat. Bowel sounds are normal. There is no distension.     Palpations: Abdomen is soft.     Tenderness: There is no abdominal tenderness.  Skin:    General: Skin is warm and dry.     Coloration: Skin is not jaundiced.  Neurological:     Mental Status: He is alert. Mental status is at baseline.     Coordination: Coordination normal.     ED Results / Procedures / Treatments   Labs (all labs ordered are listed, but only abnormal results are displayed) Labs Reviewed  BASIC METABOLIC PANEL  CBC  TROPONIN I (HIGH SENSITIVITY)    EKG None  Radiology DG Chest 2 View  Result Date: 07/22/2022 CLINICAL DATA:  Three day history of left-sided chest and back pain EXAM: CHEST - 2 VIEW COMPARISON:  None Available. FINDINGS: Normal lung volumes. Rounded opacity projecting over the right hemidiaphragm without correlate on lateral view. No pleural effusion or pneumothorax. The heart size and mediastinal contours are within normal limits. The visualized skeletal structures are unremarkable. IMPRESSION:  1. Rounded opacity projecting over the right hemidiaphragm without correlate on lateral view. While this finding may reflect artifact related to superimposition of shadows, noncontrast chest CT can be considered for further evaluation. 2. Otherwise, no focal consolidations. Electronically Signed   By: Darrin Nipper M.D.   On: 07/22/2022 13:57    Procedures Procedures    Medications Ordered in ED Medications - No data to display  ED Course/ Medical Decision Making/ A&P Clinical Course as of 07/22/22 1938  Sat Jul 22, 2022  1929 Troponin I (High Sensitivity) Negative delta troponin [HS]  123456 Basic metabolic panel(!) [HS]  123456 CBC [HS]    Clinical Course User Index [HS] Sherrill Raring, PA-C                             Medical Decision Making Amount and/or Complexity of Data Reviewed Labs: ordered. Decision-making details documented in ED Course. Radiology: ordered.  Risk Prescription drug management.   Patient presents to the emergency department due to chest pain.  Differential includes ACS, PE, pneumonia, dissection, gastritis, GERD, esophageal rupture, myocarditis, pericarditis. Reviewed external medical records including urgent care note from earlier today.  Also reviewed home medications.  Will start with labs, give patient a chest x-ray with possible nodule is having left-sided pain issues we will proceed with CT PE study to evaluate for PE.  Do not suspect this is a dissection given pulses are symmetric, no longstanding history of hypertension, not smoking and while this does not fit the clinical scenario.   PE study is negative, no mention of a nodule seen on previous chest x-ray.  I suspect that was actually artifact.  Laboratory workup is reassuring, negative delta troponin.  There is no specific ischemic changes on EKG although there is a right bundle branch block, patient in conjunction with negative delta troponin, heart score 3 I do think he is appropriate for close  outpatient follow-up with cardiology.  I put an ambulatory referral so patient can able to get close follow-up and I discussed return precautions the patient verbalized understanding.  Suspect emergent etiology of chest pain based on today's evaluation, stable for outpatient follow-up.        Final Clinical Impression(s) / ED Diagnoses Final diagnoses:  None    Rx / DC Orders ED Discharge Orders     None         Sherrill Raring, PA-C 07/22/22 [place], [place], DO 07/27/22 [phone]

## 2022-09-12 ENCOUNTER — Ambulatory Visit: Payer: BC Managed Care – PPO | Attending: Internal Medicine | Admitting: Internal Medicine

## 2022-09-12 ENCOUNTER — Encounter: Payer: Self-pay | Admitting: Internal Medicine

## 2022-09-12 VITALS — BP 124/84 | HR 72 | Ht 67.0 in | Wt 194.0 lb

## 2022-09-12 DIAGNOSIS — R079 Chest pain, unspecified: Secondary | ICD-10-CM | POA: Diagnosis not present

## 2022-09-12 NOTE — Progress Notes (Signed)
Cardiology Office Note   Date:  09/12/2022   ID:  Michael Stafford, DOB 1970/12/12, MRN [id number]  PCP:  [facility]  Cardiologist:   Dietrich Pates, MD    Pt presents for eval of CP    History of Present Illness: Jemir Soergel is a 52 y.o. male with a history of HTN, HL, IBS  The pt was see at [facility] ER in March 2023 for CP     He says pain started a few days before visit    L side and back   COnstant   WOrse with certain positions   Accompanied by belching Trop negative   Sent home    With risk factors referred to cardiology  Since seen the pain and indigestion have resolved    Breathing is OK     Diet:   Breakfast  Bacon biscuit   Soda  Coke or pepsi or mtn dew Lunch;  Fast food    Or left overs   Soda (sugar) Supper:   Week:   Beatrix Fetters  Meat and veggies  Water Snacks  Nabs,   Soda      Current Meds  Medication Sig   acetaminophen (TYLENOL) 500 MG tablet Take 500-1,000 mg by mouth every 8 (eight) hours as needed for moderate pain or mild pain.   atorvastatin (LIPITOR) 20 MG tablet Take 20 mg by mouth every morning.   fluticasone (FLONASE) 50 MCG/ACT nasal spray Place 2 sprays into both nostrils daily as needed for allergies or rhinitis.   ibuprofen (ADVIL,MOTRIN) 200 MG tablet Take 200 mg by mouth every 6 (six) hours as needed for headache or mild pain.   lisinopril (ZESTRIL) 10 MG tablet Take 10 mg by mouth daily.   omeprazole (PRILOSEC) 20 MG capsule Take 1 capsule (20 mg total) by mouth daily.     Allergies:   Sulfa antibiotics   Past Medical History:  Diagnosis Date   COVID-19 08/2020   Hyperlipidemia    Hypertension    IBS (irritable bowel syndrome)    Renal disorder    kidney stones    Past Surgical History:  Procedure Laterality Date   LASIK Bilateral      Social History:  The patient  reports that he has never smoked. He has never used smokeless tobacco. He reports that he does not drink alcohol and does not use  drugs.   Family History:  The patient's family history includes COPD in his father; Coronary artery disease in his father and mother; Diabetes in his mother.    ROS:  Please see the history of present illness. All other systems are reviewed and  Negative to the above problem except as noted.    PHYSICAL EXAM: VS:  BP 124/84   Pulse 72   Ht 5\' 7"  (1.702 m)   Wt 194 lb (88 kg)   SpO2 97%   BMI 30.38 kg/m   GEN: Obese 52 yo  in no acute distress  HEENT: normal  Neck: no JVD, carotid bruits Cardiac: RRR; no murmur edema  Respiratory:  clear to auscultation  GI: soft, nontender, nondistended,  No hepatomegaly   EKG:  EKG is ordered today.   Lipid Panel No results found for: "CHOL", "TRIG", "HDL", "CHOLHDL", "VLDL", "LDLCALC", "LDLDIRECT"    Wt Readings from Last 3 Encounters:  09/12/22 194 lb (88 kg)  07/22/22 195 lb (88.5 kg)  07/22/22 195 lb (88.5 kg)  ASSESSMENT AND PLAN:  1  Chest pain   Atypical    I do not think it is cardiac      2  HTN   BP is well controlled on current regimen  3  HL  Patient will get labs soon at PCP   I would like lipomed, Lpa and APo B  4  Diet    Spent extensive time with patient discussing diet    Needs to go to whole natural food    Lots of veggies   Cut out SSB     Current medicines are reviewed at length with the patient today.  The patient does not have concerns regarding medicines.  Signed, Dietrich Pates, MD  09/12/2022 9:20 AM    [facility] [address] [place], [place], [place]  [address] Phone: [phone]; Fax: [phone]

## 2022-09-12 NOTE — Patient Instructions (Signed)
Medication Instructions:  Your physician recommends that you continue on your current medications as directed. Please refer to the Current Medication list given to you today.  *If you need a refill on your cardiac medications before your next appointment, please call your pharmacy*   Lab Work: Your physician recommends that you return for lab work in:  Lipiomed/ NMR profile LAB 3258  Apo B LAB 2248  Lp(a)  LAB 563  Uric Acid   If you have labs (blood work) drawn today and your tests are completely normal, you will receive your results only by: MyChart Message (if you have MyChart) OR A paper copy in the mail If you have any lab test that is abnormal or we need to change your treatment, we will call you to review the results.   Testing/Procedures: Calcium Score CT    Follow-Up: At [facility], you and your health needs are our priority.  As part of our continuing mission to provide you with exceptional heart care, we have created designated Provider Care Teams.  These Care Teams include your primary Cardiologist (physician) and Advanced Practice Providers (APPs -  Physician Assistants and Nurse Practitioners) who all work together to provide you with the care you need, when you need it.  We recommend signing up for the patient portal called "MyChart".  Sign up information is provided on this After Visit Summary.  MyChart is used to connect with patients for Virtual Visits (Telemedicine).  Patients are able to view lab/test results, encounter notes, upcoming appointments, etc.  Non-urgent messages can be sent to your provider as well.   To learn more about what you can do with MyChart, go to [email or web address].    Your next appointment:   Pending test Results    Provider:   Dietrich Pates, MD    Other Instructions Thank you for choosing [facility]!

## 2022-11-07 ENCOUNTER — Ambulatory Visit (HOSPITAL_COMMUNITY)
Admission: RE | Admit: 2022-11-07 | Discharge: 2022-11-07 | Disposition: A | Discharge status: Home / Self Care | Payer: BC Managed Care – PPO | Source: Ambulatory Visit | Attending: Internal Medicine | Admitting: Internal Medicine

## 2022-11-07 DIAGNOSIS — R079 Chest pain, unspecified: Secondary | ICD-10-CM | POA: Insufficient documentation

## 2022-12-06 ENCOUNTER — Ambulatory Visit: Payer: BC Managed Care – PPO | Admitting: Pulmonary Disease

## 2022-12-06 ENCOUNTER — Encounter: Payer: Self-pay | Admitting: Pulmonary Disease

## 2022-12-06 VITALS — BP 103/68 | HR 91 | Ht 67.0 in | Wt 179.0 lb

## 2022-12-06 DIAGNOSIS — G4733 Obstructive sleep apnea (adult) (pediatric): Secondary | ICD-10-CM | POA: Diagnosis not present

## 2022-12-06 NOTE — Patient Instructions (Signed)
Follow up in 1 year.

## 2022-12-06 NOTE — Progress Notes (Signed)
[facility]  Chief Complaint  Patient presents with   Sleep Apnea    Oral Appliance    Constitutional:  BP 103/68   Pulse 91   Ht 5\' 7"  (1.702 m)   Wt 179 lb (81.2 kg)   SpO2 96%   BMI 28.04 kg/m   Past Medical History:  HLD, HTN, IBS, Nephrolithiasis, COVID 09 Sep 2020  Past Surgical History:  He  has a past surgical history that includes LASIK (Bilateral).  Brief Summary:  Michael Stafford is a 52 y.o. male with obstructive sleep apnea.      Subjective:   He had chest pain a couple months ago.  Went to the hospital.  Cardiac assessment was okay.  He started worrying more about his health and started exercising.  He has lost about 15 lbs.  He sometimes has trouble sleeping if he exercises too close to bedtime.  Goes to bed at 11 pm.  Sometimes has trouble falling asleep.  Uses ibuprofen intermittently for hip pain at night.  Gets out of bed at 630 am.  Feels rested.  Takes a 10 minute power nap during his lunch break.  No issues with jaw soreness, chewing, or swallowing.  Physical Exam:   Appearance - well kempt   ENMT - no sinus tenderness, no oral exudate, no LAN, Mallampati 4 airway, no stridor  Respiratory - equal breath sounds bilaterally, no wheezing or rales  CV - s1s2 regular rate and rhythm, no murmurs  Ext - no clubbing, no edema  Skin - no rashes  Psych - normal mood and affect    Sleep Tests:  HST 02/07/20 >> AHI 6.9, SpO2 low 83%.  Social History:  He  reports that he has never smoked. He has never used smokeless tobacco. He reports that he does not drink alcohol and does not use drugs.  Family History:  His family history includes COPD in his father; Coronary artery disease in his father and mother; Diabetes in his mother.     Assessment/Plan:   Obstructive sleep apnea. - he has oral appliance set up through Dr. Irene Limbo - reviewed proper sleep hygiene - if he continues to make progress  with weight loss, then might be able to repeat sleep study without oral appliance to see if he still has sleep apnea  Time Spent Involved in Patient Care on Day of Examination:  16 minutes  Follow up:   Patient Instructions  Follow up in 1 year  Medication List:   Allergies as of 12/06/2022       Reactions   Sulfa Antibiotics    Rash        Medication List        Accurate as of December 06, 2022  4:24 PM. If you have any questions, ask your nurse or doctor.          STOP taking these medications    naproxen sodium 220 MG tablet Commonly known as: ALEVE Stopped by: Hikaru Delorenzo   omeprazole 20 MG capsule Commonly known as: PRILOSEC Stopped by: Coralyn Helling       TAKE these medications    acetaminophen 500 MG tablet Commonly known as: TYLENOL Take 500-1,000 mg by mouth every 8 (eight) hours as needed for moderate pain or mild pain.   atorvastatin 20 MG tablet Commonly known as: LIPITOR Take 20 mg by mouth every morning.   famotidine 40 MG tablet Commonly known as: PEPCID Take 40 mg  by mouth at bedtime.   fluticasone 50 MCG/ACT nasal spray Commonly known as: FLONASE Place 2 sprays into both nostrils daily as needed for allergies or rhinitis.   ibuprofen 200 MG tablet Commonly known as: ADVIL Take 200 mg by mouth every 6 (six) hours as needed for headache or mild pain.   lisinopril 10 MG tablet Commonly known as: ZESTRIL Take 10 mg by mouth daily.        Signature:  Coralyn Helling, MD [facility] Pager - [phone] 12/06/2022, 4:24 PM

## 2023-05-09 ENCOUNTER — Encounter (INDEPENDENT_AMBULATORY_CARE_PROVIDER_SITE_OTHER): Payer: Self-pay | Admitting: *Deleted

## 2023-08-14 ENCOUNTER — Ambulatory Visit
Admission: RE | Admit: 2023-08-14 | Discharge: 2023-08-14 | Disposition: A | Discharge status: Home / Self Care | Source: Ambulatory Visit | Attending: Family Medicine | Admitting: Family Medicine

## 2023-08-14 VITALS — BP 116/81 | HR 97 | Temp 97.8°F | Resp 16

## 2023-08-14 DIAGNOSIS — J22 Unspecified acute lower respiratory infection: Secondary | ICD-10-CM

## 2023-08-14 DIAGNOSIS — Z20822 Contact with and (suspected) exposure to covid-19: Secondary | ICD-10-CM

## 2023-08-14 MED ORDER — AZITHROMYCIN 250 MG PO TABS: ORAL_TABLET | ORAL | 0 refills | Status: AC

## 2023-08-14 MED ORDER — PROMETHAZINE-DM 6.25-15 MG/5ML PO SYRP: 5.0000 mL | ORAL_SOLUTION | Freq: Four times a day (QID) | ORAL | 0 refills | Status: AC | PRN

## 2023-08-14 NOTE — ED Triage Notes (Signed)
 Pt reports he has a cough, nasal congestion, fatigue, and diarrhea x 10 days  Complains of a sore throat    Took Flonase, sudafed, and mucinex

## 2023-08-14 NOTE — ED Provider Notes (Signed)
 [facility]    CSN: [id number] Arrival date & time: 08/14/23  1747      History   Chief Complaint Chief Complaint  Patient presents with   Cough    Cough/cold symptoms for 10 days.  Tested negative for COVID and flu yesterday, but wife tested positive.  She's been sick 4 less days than me. - Entered by patient    HPI Michael Stafford is a 53 y.o. male.   Patient presenting today with about a week and a half of cough, congestion, fatigue, diarrhea, sore throat.  Thought he was getting better a few days ago but symptoms have now worsened.  Taking Flonase, Sudafed and Mucinex with minimal relief.  He states home COVID test was negative yesterday but his wife's test was positive and she is about 4 days behind him in symptoms.  No known history of chronic pulmonary disease.    Past Medical History:  Diagnosis Date   COVID-19 08/2020   Hyperlipidemia    Hypertension    IBS (irritable bowel syndrome)    Renal disorder    kidney stones    Patient Active Problem List   Diagnosis Date Noted   OSA (obstructive sleep apnea) 02/12/2020    Past Surgical History:  Procedure Laterality Date   LASIK Bilateral        Home Medications    Prior to Admission medications   Medication Sig Start Date End Date Taking? Authorizing Provider  azithromycin  (ZITHROMAX ) 250 MG tablet Take first 2 tablets together, then 1 every day until finished. 08/14/23  Yes Corbin Dess, PA-C  promethazine -dextromethorphan (PROMETHAZINE -DM) 6.25-15 MG/5ML syrup Take 5 mLs by mouth 4 (four) times daily as needed. 08/14/23  Yes Corbin Dess, PA-C  acetaminophen  (TYLENOL ) 500 MG tablet Take 500-1,000 mg by mouth every 8 (eight) hours as needed for moderate pain or mild pain.    [provider]  atorvastatin (LIPITOR) 20 MG tablet Take 20 mg by mouth every morning.    [provider]  famotidine (PEPCID) 40 MG tablet Take 40 mg by mouth at bedtime. 09/22/22    [provider]  fluticasone (FLONASE) 50 MCG/ACT nasal spray Place 2 sprays into both nostrils daily as needed for allergies or rhinitis. 07/14/19   [provider]  ibuprofen (ADVIL,MOTRIN) 200 MG tablet Take 200 mg by mouth every 6 (six) hours as needed for headache or mild pain.    [provider]  lisinopril (ZESTRIL) 10 MG tablet Take 10 mg by mouth daily. 11/25/19   [provider]    Family History Family History  Problem Relation Age of Onset   Coronary artery disease Mother    Diabetes Mother    COPD Father    Coronary artery disease Father     Social History Social History   Tobacco Use   Smoking status: Never   Smokeless tobacco: Never  Vaping Use   Vaping status: Never Used  Substance Use Topics   Alcohol use: No   Drug use: No     Allergies   Sulfa antibiotics   Review of Systems Review of Systems Per HPI  Physical Exam Triage Vital Signs ED Triage Vitals  Encounter Vitals Group     BP 08/14/23 1754 116/81     Systolic BP Percentile --      Diastolic BP Percentile --      Pulse Rate 08/14/23 1754 97     Resp 08/14/23 1754 16     Temp  08/14/23 1754 97.8 F (36.6 C)     Temp Source 08/14/23 1754 Oral     SpO2 08/14/23 1754 96 %     Weight --      Height --      Head Circumference --      Peak Flow --      Pain Score 08/14/23 1756 3     Pain Loc --      Pain Education --      Exclude from Growth Chart --    No data found.  Updated Vital Signs BP 116/81 (BP Location: Right Arm)   Pulse 97   Temp 97.8 F (36.6 C) (Oral)   Resp 16   SpO2 96%   Visual Acuity Right Eye Distance:   Left Eye Distance:   Bilateral Distance:    Right Eye Near:   Left Eye Near:    Bilateral Near:     Physical Exam Vitals and nursing note reviewed.  Constitutional:      Appearance: He is well-developed.  HENT:     Head: Atraumatic.     Right Ear: External ear normal.     Left Ear: External ear normal.     Nose:  Congestion present.     Mouth/Throat:     Pharynx: Posterior oropharyngeal erythema present. No oropharyngeal exudate.  Eyes:     Conjunctiva/sclera: Conjunctivae normal.     Pupils: Pupils are equal, round, and reactive to light.  Cardiovascular:     Rate and Rhythm: Normal rate and regular rhythm.  Pulmonary:     Effort: Pulmonary effort is normal. No respiratory distress.     Breath sounds: No wheezing or rales.  Musculoskeletal:        General: Normal range of motion.     Cervical back: Normal range of motion and neck supple.  Lymphadenopathy:     Cervical: No cervical adenopathy.  Skin:    General: Skin is warm and dry.  Neurological:     Mental Status: He is alert and oriented to person, place, and time.  Psychiatric:        Behavior: Behavior normal.      UC Treatments / Results  Labs (all labs ordered are listed, but only abnormal results are displayed) Labs Reviewed - No data to display  EKG   Radiology No results found.  Procedures Procedures (including critical care time)  Medications Ordered in UC Medications - No data to display  Initial Impression / Assessment and Plan / UC Course  I have reviewed the triage vital signs and the nursing notes.  Pertinent labs & imaging results that were available during my care of the patient were reviewed by me and considered in my medical decision making (see chart for details).     Vital signs and exam reassuring today but given duration and worsening course, will treat for lower respiratory infection secondary to likely COVID with Zithromax , Phenergan  DM, supportive over-the-counter medications and home care.  Return for worsening symptoms.  Final Clinical Impressions(s) / UC Diagnoses   Final diagnoses:  Lower respiratory infection  Exposure to COVID-19 virus   Discharge Instructions   None    ED Prescriptions     Medication Sig Dispense Auth. Provider   azithromycin  (ZITHROMAX ) 250 MG tablet Take  first 2 tablets together, then 1 every day until finished. 6 tablet Corbin Dess, PA-C   promethazine -dextromethorphan (PROMETHAZINE -DM) 6.25-15 MG/5ML syrup Take 5 mLs by mouth 4 (four) times daily as needed. 100  mL Corbin Dess, PA-C      PDMP not reviewed this encounter.   Corbin Dess, [place] 08/14/23 1904

## 2023-08-25 ENCOUNTER — Ambulatory Visit
Admission: RE | Admit: 2023-08-25 | Discharge: 2023-08-25 | Disposition: A | Discharge status: Home / Self Care | Payer: Self-pay | Source: Ambulatory Visit | Attending: Family Medicine | Admitting: Family Medicine

## 2023-08-25 ENCOUNTER — Other Ambulatory Visit: Payer: Self-pay

## 2023-08-25 VITALS — BP 118/62 | HR 92 | Temp 98.1°F | Resp 18

## 2023-08-25 DIAGNOSIS — J209 Acute bronchitis, unspecified: Secondary | ICD-10-CM

## 2023-08-25 MED ORDER — DEXAMETHASONE SODIUM PHOSPHATE 10 MG/ML IJ SOLN
10.0000 mg | Freq: Once | INTRAMUSCULAR | Status: AC
  Administered 2023-08-25: 10 mg via INTRAMUSCULAR

## 2023-08-25 NOTE — ED Triage Notes (Signed)
 Pt reports was exposed to covid around [place] and reports "i was never tested but had all the symptoms." Pt reports "other symptoms" have improved but cough remains with intermittent rib pain.NAD noted.

## 2023-08-25 NOTE — ED Provider Notes (Signed)
 [facility]    CSN: [id number] Arrival date & time: 08/25/23  0954      History   Chief Complaint Chief Complaint  Patient presents with   Cough    Persistent cough for almost 3 weeks.  Visited 10 days ago. - Entered by patient    HPI Michael Stafford is a 53 y.o. male.   Patient presenting today with 3 weeks of ongoing mostly dry hacking cough now with some right lower rib pain with coughing.  Was seen 10 days ago and prescribed a cough syrup and Zithromax  but states the cough lingers.  Denies fever, chest pain, shortness of breath, abdominal pain, vomiting, diarrhea.  So far not taking anything over-the-counter for symptoms.    Past Medical History:  Diagnosis Date   COVID-19 08/2020   Hyperlipidemia    Hypertension    IBS (irritable bowel syndrome)    Renal disorder    kidney stones    Patient Active Problem List   Diagnosis Date Noted   OSA (obstructive sleep apnea) 02/12/2020    Past Surgical History:  Procedure Laterality Date   LASIK Bilateral        Home Medications    Prior to Admission medications   Medication Sig Start Date End Date Taking? Authorizing Provider  acetaminophen  (TYLENOL ) 500 MG tablet Take 500-1,000 mg by mouth every 8 (eight) hours as needed for moderate pain or mild pain.    [provider]  atorvastatin (LIPITOR) 20 MG tablet Take 20 mg by mouth every morning.    [provider]  azithromycin  (ZITHROMAX ) 250 MG tablet Take first 2 tablets together, then 1 every day until finished. 08/14/23   Corbin Dess, PA-C  famotidine (PEPCID) 40 MG tablet Take 40 mg by mouth at bedtime. 09/22/22   [provider]  fluticasone (FLONASE) 50 MCG/ACT nasal spray Place 2 sprays into both nostrils daily as needed for allergies or rhinitis. 07/14/19   [provider]  ibuprofen (ADVIL,MOTRIN) 200 MG tablet Take 200 mg by mouth every 6 (six) hours as needed for headache or mild pain.    [provider]  lisinopril (ZESTRIL) 10 MG tablet Take 10 mg by mouth daily. 11/25/19   [provider]  promethazine -dextromethorphan (PROMETHAZINE -DM) 6.25-15 MG/5ML syrup Take 5 mLs by mouth 4 (four) times daily as needed. 08/14/23   Corbin Dess, PA-C    Family History Family History  Problem Relation Age of Onset   Coronary artery disease Mother    Diabetes Mother    COPD Father    Coronary artery disease Father     Social History Social History   Tobacco Use   Smoking status: Never   Smokeless tobacco: Never  Vaping Use   Vaping status: Never Used  Substance Use Topics   Alcohol use: No   Drug use: No     Allergies   Sulfa antibiotics   Review of Systems Review of Systems Per HPI  Physical Exam Triage Vital Signs ED Triage Vitals  Encounter Vitals Group     BP 08/25/23 1001 118/62     Systolic BP Percentile --      Diastolic BP Percentile --      Pulse Rate 08/25/23 1001 92     Resp 08/25/23 1001 18     Temp 08/25/23 1001 98.1 F (36.7 C)     Temp Source 08/25/23 1001 Oral     SpO2 08/25/23 1001 96 %     Weight --  Height --      Head Circumference --      Peak Flow --      Pain Score 08/25/23 1004 0     Pain Loc --      Pain Education --      Exclude from Growth Chart --    No data found.  Updated Vital Signs BP 118/62 (BP Location: Right Arm)   Pulse 92   Temp 98.1 F (36.7 C) (Oral)   Resp 18   SpO2 96%   Visual Acuity Right Eye Distance:   Left Eye Distance:   Bilateral Distance:    Right Eye Near:   Left Eye Near:    Bilateral Near:     Physical Exam Vitals and nursing note reviewed.  Constitutional:      Appearance: Normal appearance.  HENT:     Head: Atraumatic.     Nose: Nose normal.     Mouth/Throat:     Mouth: Mucous membranes are moist.     Pharynx: Oropharynx is clear.  Eyes:     Extraocular Movements: Extraocular movements intact.     Conjunctiva/sclera: Conjunctivae normal.   Cardiovascular:     Rate and Rhythm: Normal rate and regular rhythm.  Pulmonary:     Effort: Pulmonary effort is normal.     Breath sounds: Normal breath sounds. No wheezing or rales.  Musculoskeletal:        General: Normal range of motion.     Cervical back: Normal range of motion and neck supple.  Skin:    General: Skin is warm and dry.  Neurological:     General: No focal deficit present.     Mental Status: He is oriented to person, place, and time.  Psychiatric:        Mood and Affect: Mood normal.        Thought Content: Thought content normal.        Judgment: Judgment normal.      UC Treatments / Results  Labs (all labs ordered are listed, but only abnormal results are displayed) Labs Reviewed - No data to display  EKG   Radiology No results found.  Procedures Procedures (including critical care time)  Medications Ordered in UC Medications  dexamethasone (DECADRON) injection 10 mg (10 mg Intramuscular Given 08/25/23 1024)    Initial Impression / Assessment and Plan / UC Course  I have reviewed the triage vital signs and the nursing notes.  Pertinent labs & imaging results that were available during my care of the patient were reviewed by me and considered in my medical decision making (see chart for details).     Vitals and exam very reassuring today, no evidence of pneumonia today and no improvement on recent antibiotics.  Will treat for bronchitis with IM Decadron and continue supportive over-the-counter medications and home care additionally.  Return for worsening symptoms.  Final Clinical Impressions(s) / UC Diagnoses   Final diagnoses:  Acute bronchitis, unspecified organism     Discharge Instructions      Continue Flonase and an antihistamine daily, may take Coricidin HBP and Delsym as needed and we have given you a steroid shot today.  Follow-up for worsening symptoms.    ED Prescriptions   None    PDMP not reviewed this encounter.    Corbin Dess, [place] 08/25/23 1105

## 2023-08-25 NOTE — Discharge Instructions (Signed)
 Continue Flonase and an antihistamine daily, may take Coricidin HBP and Delsym as needed and we have given you a steroid shot today.  Follow-up for worsening symptoms.

## 2023-11-07 ENCOUNTER — Encounter (INDEPENDENT_AMBULATORY_CARE_PROVIDER_SITE_OTHER): Payer: Self-pay | Admitting: *Deleted
